# Patient Record
Sex: Female | Born: 1945 | Race: White | Hispanic: No | Marital: Married | Smoking: Current every day smoker
Health system: Southern US, Community
[De-identification: ages and names within clinical notes are randomized; demographics above are authoritative.]

## PROBLEM LIST (undated history)

## (undated) DIAGNOSIS — F32A Depression, unspecified: Secondary | ICD-10-CM

## (undated) DIAGNOSIS — I10 Essential (primary) hypertension: Secondary | ICD-10-CM

## (undated) DIAGNOSIS — R519 Headache, unspecified: Secondary | ICD-10-CM

## (undated) DIAGNOSIS — R079 Chest pain, unspecified: Secondary | ICD-10-CM

## (undated) DIAGNOSIS — I341 Nonrheumatic mitral (valve) prolapse: Secondary | ICD-10-CM

## (undated) DIAGNOSIS — F329 Major depressive disorder, single episode, unspecified: Secondary | ICD-10-CM

## (undated) DIAGNOSIS — F419 Anxiety disorder, unspecified: Secondary | ICD-10-CM

## (undated) DIAGNOSIS — M1990 Unspecified osteoarthritis, unspecified site: Secondary | ICD-10-CM

## (undated) DIAGNOSIS — R51 Headache: Secondary | ICD-10-CM

## (undated) DIAGNOSIS — R011 Cardiac murmur, unspecified: Secondary | ICD-10-CM

## (undated) DIAGNOSIS — Z720 Tobacco use: Secondary | ICD-10-CM

## (undated) DIAGNOSIS — G709 Myoneural disorder, unspecified: Secondary | ICD-10-CM

## (undated) DIAGNOSIS — E785 Hyperlipidemia, unspecified: Secondary | ICD-10-CM

## (undated) DIAGNOSIS — K219 Gastro-esophageal reflux disease without esophagitis: Secondary | ICD-10-CM

## (undated) DIAGNOSIS — G894 Chronic pain syndrome: Secondary | ICD-10-CM

## (undated) DIAGNOSIS — G43909 Migraine, unspecified, not intractable, without status migrainosus: Secondary | ICD-10-CM

## (undated) HISTORY — DX: Chronic pain syndrome: G89.4

## (undated) HISTORY — DX: Headache: R51

## (undated) HISTORY — DX: Gastro-esophageal reflux disease without esophagitis: K21.9

## (undated) HISTORY — PX: ABDOMINAL HYSTERECTOMY: SHX81

## (undated) HISTORY — DX: Migraine, unspecified, not intractable, without status migrainosus: G43.909

## (undated) HISTORY — PX: CATARACT EXTRACTION: SUR2

## (undated) HISTORY — PX: OTHER SURGICAL HISTORY: SHX169

## (undated) HISTORY — DX: Headache, unspecified: R51.9

## (undated) HISTORY — PX: CARPAL TUNNEL RELEASE: SHX101

## (undated) HISTORY — DX: Chest pain, unspecified: R07.9

## (undated) HISTORY — DX: Hyperlipidemia, unspecified: E78.5

## (undated) HISTORY — PX: SHOULDER ARTHROSCOPY: SHX128

## (undated) HISTORY — PX: EYE SURGERY: SHX253

## (undated) HISTORY — DX: Nonrheumatic mitral (valve) prolapse: I34.1

## (undated) HISTORY — DX: Tobacco use: Z72.0

---

## 1998-10-09 ENCOUNTER — Encounter: Payer: Self-pay | Admitting: Gynecology

## 1998-10-10 ENCOUNTER — Ambulatory Visit: Admission: RE | Admit: 1998-10-10 | Discharge: 1998-10-10 | Payer: Self-pay | Admitting: Gynecology

## 1998-10-30 ENCOUNTER — Ambulatory Visit (HOSPITAL_COMMUNITY): Admission: RE | Admit: 1998-10-30 | Discharge: 1998-10-30 | Payer: Self-pay | Admitting: Gastroenterology

## 1998-11-18 ENCOUNTER — Inpatient Hospital Stay (HOSPITAL_COMMUNITY): Admission: RE | Admit: 1998-11-18 | Discharge: 1998-11-19 | Payer: Self-pay | Admitting: Gynecology

## 1999-11-24 ENCOUNTER — Other Ambulatory Visit: Admission: RE | Admit: 1999-11-24 | Discharge: 1999-11-24 | Payer: Self-pay | Admitting: Gynecology

## 2000-05-29 ENCOUNTER — Other Ambulatory Visit: Admission: RE | Admit: 2000-05-29 | Discharge: 2000-05-29 | Payer: Self-pay | Admitting: Gynecology

## 2001-02-05 ENCOUNTER — Encounter: Admission: RE | Admit: 2001-02-05 | Discharge: 2001-02-13 | Payer: Self-pay | Admitting: Orthopedic Surgery

## 2001-05-29 ENCOUNTER — Other Ambulatory Visit: Admission: RE | Admit: 2001-05-29 | Discharge: 2001-05-29 | Payer: Self-pay | Admitting: Gynecology

## 2002-05-14 ENCOUNTER — Other Ambulatory Visit: Admission: RE | Admit: 2002-05-14 | Discharge: 2002-05-14 | Payer: Self-pay | Admitting: Gynecology

## 2002-06-17 ENCOUNTER — Ambulatory Visit (HOSPITAL_COMMUNITY): Admission: RE | Admit: 2002-06-17 | Discharge: 2002-06-17 | Payer: Self-pay | Admitting: Gastroenterology

## 2002-06-17 ENCOUNTER — Encounter (INDEPENDENT_AMBULATORY_CARE_PROVIDER_SITE_OTHER): Payer: Self-pay | Admitting: *Deleted

## 2003-02-26 ENCOUNTER — Encounter: Admission: RE | Admit: 2003-02-26 | Discharge: 2003-02-26 | Payer: Self-pay | Admitting: General Surgery

## 2003-02-26 ENCOUNTER — Encounter: Payer: Self-pay | Admitting: General Surgery

## 2003-06-10 ENCOUNTER — Other Ambulatory Visit: Admission: RE | Admit: 2003-06-10 | Discharge: 2003-06-10 | Payer: Self-pay | Admitting: Gynecology

## 2003-09-01 ENCOUNTER — Encounter: Admission: RE | Admit: 2003-09-01 | Discharge: 2003-09-01 | Payer: Self-pay | Admitting: General Surgery

## 2003-09-01 ENCOUNTER — Encounter: Payer: Self-pay | Admitting: General Surgery

## 2004-03-01 ENCOUNTER — Encounter: Admission: RE | Admit: 2004-03-01 | Discharge: 2004-03-01 | Payer: Self-pay | Admitting: Gynecology

## 2004-06-22 ENCOUNTER — Other Ambulatory Visit: Admission: RE | Admit: 2004-06-22 | Discharge: 2004-06-22 | Payer: Self-pay | Admitting: Gynecology

## 2004-07-02 ENCOUNTER — Ambulatory Visit (HOSPITAL_BASED_OUTPATIENT_CLINIC_OR_DEPARTMENT_OTHER): Admission: RE | Admit: 2004-07-02 | Discharge: 2004-07-02 | Payer: Self-pay | Admitting: Orthopedic Surgery

## 2004-07-02 ENCOUNTER — Ambulatory Visit (HOSPITAL_COMMUNITY): Admission: RE | Admit: 2004-07-02 | Discharge: 2004-07-02 | Payer: Self-pay | Admitting: Orthopedic Surgery

## 2004-11-02 ENCOUNTER — Ambulatory Visit: Payer: Self-pay | Admitting: Family Medicine

## 2005-03-15 ENCOUNTER — Encounter: Admission: RE | Admit: 2005-03-15 | Discharge: 2005-03-15 | Payer: Self-pay | Admitting: Gynecology

## 2005-04-06 ENCOUNTER — Ambulatory Visit: Payer: Self-pay | Admitting: Family Medicine

## 2005-06-22 ENCOUNTER — Other Ambulatory Visit: Admission: RE | Admit: 2005-06-22 | Discharge: 2005-06-22 | Payer: Self-pay | Admitting: Gynecology

## 2005-10-11 ENCOUNTER — Ambulatory Visit: Payer: Self-pay | Admitting: Family Medicine

## 2006-02-01 ENCOUNTER — Ambulatory Visit: Payer: Self-pay | Admitting: Family Medicine

## 2006-02-21 ENCOUNTER — Ambulatory Visit: Payer: Self-pay | Admitting: Family Medicine

## 2006-02-28 ENCOUNTER — Ambulatory Visit: Payer: Self-pay | Admitting: Family Medicine

## 2006-03-21 ENCOUNTER — Encounter: Admission: RE | Admit: 2006-03-21 | Discharge: 2006-04-11 | Payer: Self-pay | Admitting: Orthopedic Surgery

## 2006-04-06 ENCOUNTER — Encounter: Admission: RE | Admit: 2006-04-06 | Discharge: 2006-04-06 | Payer: Self-pay | Admitting: Gynecology

## 2006-06-08 ENCOUNTER — Other Ambulatory Visit: Admission: RE | Admit: 2006-06-08 | Discharge: 2006-06-08 | Payer: Self-pay | Admitting: Gynecology

## 2006-06-08 ENCOUNTER — Ambulatory Visit: Payer: Self-pay | Admitting: Family Medicine

## 2007-04-10 ENCOUNTER — Encounter: Admission: RE | Admit: 2007-04-10 | Discharge: 2007-04-10 | Payer: Self-pay | Admitting: Gynecology

## 2007-06-12 ENCOUNTER — Other Ambulatory Visit: Admission: RE | Admit: 2007-06-12 | Discharge: 2007-06-12 | Payer: Self-pay | Admitting: Gynecology

## 2008-04-10 ENCOUNTER — Encounter: Admission: RE | Admit: 2008-04-10 | Discharge: 2008-04-10 | Payer: Self-pay | Admitting: Gynecology

## 2008-04-21 ENCOUNTER — Encounter: Admission: RE | Admit: 2008-04-21 | Discharge: 2008-04-21 | Payer: Self-pay | Admitting: Gynecology

## 2008-06-30 ENCOUNTER — Other Ambulatory Visit: Admission: RE | Admit: 2008-06-30 | Discharge: 2008-06-30 | Payer: Self-pay | Admitting: Gynecology

## 2009-04-16 ENCOUNTER — Encounter: Admission: RE | Admit: 2009-04-16 | Discharge: 2009-04-16 | Payer: Self-pay | Admitting: Gynecology

## 2009-04-23 ENCOUNTER — Encounter: Admission: RE | Admit: 2009-04-23 | Discharge: 2009-04-23 | Payer: Self-pay | Admitting: Gynecology

## 2010-04-26 ENCOUNTER — Encounter: Admission: RE | Admit: 2010-04-26 | Discharge: 2010-04-26 | Payer: Self-pay | Admitting: Gynecology

## 2010-10-25 ENCOUNTER — Encounter: Payer: Self-pay | Admitting: Physician Assistant

## 2010-10-26 ENCOUNTER — Encounter: Payer: Self-pay | Admitting: Physician Assistant

## 2010-10-26 ENCOUNTER — Ambulatory Visit: Payer: Self-pay | Admitting: Cardiology

## 2010-11-01 ENCOUNTER — Encounter: Payer: Self-pay | Admitting: Physician Assistant

## 2010-11-01 ENCOUNTER — Ambulatory Visit: Payer: Self-pay | Admitting: Cardiology

## 2010-11-01 DIAGNOSIS — R079 Chest pain, unspecified: Secondary | ICD-10-CM

## 2010-11-01 DIAGNOSIS — K219 Gastro-esophageal reflux disease without esophagitis: Secondary | ICD-10-CM | POA: Insufficient documentation

## 2010-11-01 DIAGNOSIS — F17200 Nicotine dependence, unspecified, uncomplicated: Secondary | ICD-10-CM | POA: Insufficient documentation

## 2010-11-01 DIAGNOSIS — E7800 Pure hypercholesterolemia, unspecified: Secondary | ICD-10-CM

## 2010-11-01 DIAGNOSIS — G894 Chronic pain syndrome: Secondary | ICD-10-CM | POA: Insufficient documentation

## 2010-11-22 ENCOUNTER — Ambulatory Visit: Payer: Self-pay | Admitting: Physician Assistant

## 2010-11-22 DIAGNOSIS — I10 Essential (primary) hypertension: Secondary | ICD-10-CM | POA: Insufficient documentation

## 2010-12-27 ENCOUNTER — Encounter: Payer: Self-pay | Admitting: Gynecology

## 2011-01-04 NOTE — Letter (Signed)
Summary: [facility] D/C DR. ZHOU  [facility] D[facility]C DR. ZHOU   Imported By: Zachary George 11/01/2010 13:26:54  _____________________________________________________________________  External Attachment:    Type:   Image     Comment:   External Document

## 2011-01-04 NOTE — Consult Note (Signed)
Summary: CARDIOLOGY CONSULT/ [facility]  CARDIOLOGY CONSULT/ [facility]   Imported By: Zachary George 11/01/2010 13:25:45  _____________________________________________________________________  External Attachment:    Type:   Image     Comment:   External Document

## 2011-01-06 NOTE — Assessment & Plan Note (Signed)
Summary: eph -d/c [facility] 11-22 was a new consult when in hospital.   Visit Type:  Follow-up Primary Provider:  Allyson Sabal   History of Present Illness: patient presents for post hospital followup.  She was recently referred to [place] here at [facility] for evaluation of a single episode of chest pain, with no prior history of heart disease. She ruled out for MI, had a normal 2-D echo, and was referred for an outpatient stress Cardiolite test.  The stress test was adequate and negative for any evidence of ischemia; EF 63%.  She has not had any recurrent angina. She is also close to completely stopping tobacco smoking, now down to 5 cigarettes a day.  Preventive Screening-Counseling & Management  Alcohol-Tobacco     Smoking Status: current     Smoking Cessation Counseling: yes     Packs/Day: 1/4 PPD  Current Medications (verified): 1)  Lipitor 80 Mg Tabs (Atorvastatin Calcium) .... Take 1 Tablet By Mouth Once A Day 2)  Valium 2 Mg Tabs (Diazepam) .... Take 1 Tablet By Mouth Three Times A Day 3)  Prilosec 20 Mg Cpdr (Omeprazole) .... Take 1 Tablet By Mouth Once A Day 4)  Trazodone Hcl 50 Mg Tabs (Trazodone Hcl) .... Take 1 Tablet By Mouth Once A Day 5)  Fish Oil Double Strength 1200 Mg Caps (Omega-3 Fatty Acids) .... Take 1 Tablet By Mouth Once A Day 6)  Aspirin 325 Mg Tabs (Aspirin) .... Take 1 Tablet By Mouth Once A Day 7)  Calcium-Vitamin D 600-125 Mg-Unit Tabs (Calcium-Vitamin D) .... 1200mg  Take 1 Tablet By Mouth Once A Day 8)  Ropinirole Hcl 0.5 Mg Tabs (Ropinirole Hcl) .... Take 2 Tablet By Mouth Once A Day 9)  Hydrocodone-Acetaminophen 5-500 Mg Tabs (Hydrocodone-Acetaminophen) .... As Needed Headache  Allergies (verified): No Known Drug Allergies  Comments:  Nurse/Medical Assistant: The patient's medication list and allergies were reviewed with the patient and were updated in the Medication and Allergy Lists.  Past History:  Past Medical History: Last updated:  11/01/2010 G E R D Headaches/Migraines Mitral Valve Proplase Dyslipidemia tobacco Abuse Chronic pain syndrome Chest pains  Social History: Packs/Day:  1/4 PPD  Review of Systems       No fevers, chills, hemoptysis, dysphagia, melena, hematocheezia, hematuria, rash, claudication, orthopnea, pnd, pedal edema. All other systems negative.   Vital Signs:  Patient profile:   65 year old female Height:      62 inches Weight:      131 pounds BMI:     24.05 Pulse rate:   92 / minute BP sitting:   144 / 90  (left arm) Cuff size:   regular  Vitals Entered By: Carlye Grippe (November 22, 2010 11:15 AM)  Physical Exam  Additional Exam:  GEN: 65 year old female, no distress HEENT: NCAT,PERRLA,EOMI NECK: palpable pulses, no bruits; no JVD; no TM LUNGS: CTA bilaterally HEART: RRR (S1S2); no significant murmurs; no rubs; no gallops ABD: soft, NT; intact BS EXT: intact distal pulses; no edema SKIN: warm, dry MUSC: no obvious deformity NEURO: A/O (x3)     Impression & Recommendations:  Problem # 1:  CHEST PAIN UNSPECIFIED (ICD-786.50)  no further cardiac workup indicated. Primary prevention was, however, strongly recommended. Patient is working hard on stopping smoking altogether. She is to remain on low dose aspirin, indefinitely, and to follow with her primary care physician, Dr. Samuel Jester. She is to otherwise return to Dr. Diona Browner, on an as needed basis.  Problem # 2:  HYPERTENSION (  ICD-401.9)  patient presents with no prior history of such. She was advised to monitor this in the ambulatory setting, and to follow with Dr. Charm Barges.  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 Charm Barges.  Appended Document: eph -d/c [facility] 11-22 was a new consult when in hospital.    Clinical Lists Changes  Medications: Changed medication from ASPIRIN 325 MG TABS (ASPIRIN) Take 1 tablet by mouth once a day to ASPIRIN 81 MG TBEC  (ASPIRIN) Take one tablet by mouth daily Observations: Added new observation of PI CARDIO: as needed follow up (11/22/2010 11:45)       Patient Instructions: 1)  as needed follow up

## 2011-04-22 ENCOUNTER — Other Ambulatory Visit: Payer: Self-pay | Admitting: Gynecology

## 2011-04-22 DIAGNOSIS — Z1231 Encounter for screening mammogram for malignant neoplasm of breast: Secondary | ICD-10-CM

## 2011-04-22 NOTE — Op Note (Signed)
NAME:  [name], [name]                    ACCOUNT NO.:  [id number]   MEDICAL RECORD NO.:  [id number]                   PATIENT TYPE:  AMB   LOCATION:  DSC                                  FACILITY:  [facility]   PHYSICIAN:  Katy Fitch. Naaman Plummer., M.D.          DATE OF BIRTH:  [birth year]   DATE OF PROCEDURE:  07/02/2004  DATE OF DISCHARGE:                                 OPERATIVE REPORT   PREOPERATIVE DIAGNOSES:  1. Entrapment neuropathy of median nerve, left carpal tunnel.  2. Enlarging left volar ganglion cyst adjacent to radial artery bifurcation.   POSTOPERATIVE DIAGNOSES:  1. Entrapment neuropathy of median nerve, left carpal tunnel.  2. Enlarging left volar ganglion cyst adjacent to radial artery bifurcation.   OPERATION PERFORMED:  1. Release of __________  transverse carpal ligament.  2. Rection of __________ volar ganglion.   SURGEON:  Katy Fitch. Sypher, M.D.   ASSISTANT:  Jonni Sanger, P.A.   ANESTHESIA:  General by LMA.   SUPERVISING ANESTHESIOLOGIST:  Janetta Hora. Gelene Mink, M.D.   INDICATIONS FOR PROCEDURE:  Amy Santos is a 65 year old woman  referred by Colon Flattery, D.O. in [place], [place] for evaluation and  management of a painful and numb left hand and an enlarging mass on the  volar radial aspect of the left wrist.  Clinical examination revealed signs  of carpal tunnel syndrome and a volar ganglion at the bifurcation of the  radial artery, radial to the flexor carpi radialis tendon.  Electrodiagnostic studies confirmed significant carpal tunnel syndrome.  Due  to failure to respond to nonoperative measures, the patient is brought to  the operating room at this time anticipating left carpal tunnel release and  excision of her volar ganglion.   DESCRIPTION OF PROCEDURE:  Runa Whittingham was brought to the operating  room and placed in supine position upon the operating table.  Following  induction of general anesthesia by LMA, the left  arm was prepped with  Betadine soap and solution and sterilely draped.  Following exsanguination  of the limb with an Esmarch bandage, an arterial tourniquet on the proximal  brachium was inflated to 220 mmHg.  The procedure commenced with a short  incision in line with the ring finger in the palm.  Subcutaneous tissues are  carefully divided revealing the palmar fascia.  This was split  longitudinally to reveal the common sensory branch of the median nerve.  This was followed back to the transverse carpal ligament which was carefully  isolated from the median nerve.  The ligament was released on its ulnar  border extending to the distal forearm.  This widely opened the carpal  canal.  No masses or other predicaments were noted.  The wound was then  repaired with intradermal 3-0 Prolene suture.   Attention was then turned to the volar radial wrist.  A transverse incision  was fashioned directly over the mass.  Subcutaneous tissues were carefully  divided taking care to  identify the radial artery proper and the radial  superficial sensory branches.  The cyst was quite complex with multiple  lobes surrounding and involving a plexus of veins adjacent to the  bifurcation of the radial artery.  The dorsal branch and palmar branch of  the radial artery were identified and carefully isolated followed by removal  of the cyst with the plexus of veins attached to the cyst.  The cyst  appeared to be exiting from the volar wrist capsule adjacent to the  radioscaphocapitate ligament.  All of the cyst tissues were carefully  debrided off the volar wrist ligaments and the exit point was  electrocauterized with bipolar forceps.  The wound was then irrigated and  repaired with intradermal 3-0 Prolene.   A compressive dressing was applied with a volar plaster splint maintaining  the wrist in five degrees dorsiflexion.  There were no apparent  complications.  Ms. Barletta tolerated the surgery and  anesthesia well.  She will be discharged to the care of her family with prescriptions for  Percocet 5 mg one or two tablets by mouth every four to six hours as needed  for pain, also Advil three tablets by mouth every six hours as needed for  pain.                                               Katy Fitch Naaman Plummer., M.D.    RVS/MEDQ  D:  07/02/2004  T:  07/02/2004  Job:  [id number]

## 2011-05-09 ENCOUNTER — Ambulatory Visit
Admission: RE | Admit: 2011-05-09 | Discharge: 2011-05-09 | Disposition: A | Discharge status: Home / Self Care | Payer: Private Health Insurance - Indemnity | Source: Ambulatory Visit | Attending: Gynecology | Admitting: Gynecology

## 2011-05-09 DIAGNOSIS — Z1231 Encounter for screening mammogram for malignant neoplasm of breast: Secondary | ICD-10-CM

## 2011-09-15 NOTE — Progress Notes (Signed)
This encounter was created in error - please disregard.

## 2012-04-11 ENCOUNTER — Other Ambulatory Visit: Payer: Self-pay | Admitting: Gynecology

## 2012-04-11 DIAGNOSIS — Z1231 Encounter for screening mammogram for malignant neoplasm of breast: Secondary | ICD-10-CM

## 2012-05-09 ENCOUNTER — Ambulatory Visit
Admission: RE | Admit: 2012-05-09 | Discharge: 2012-05-09 | Disposition: A | Discharge status: Home / Self Care | Payer: No Typology Code available for payment source | Source: Ambulatory Visit | Attending: Gynecology | Admitting: Gynecology

## 2012-05-09 DIAGNOSIS — Z1231 Encounter for screening mammogram for malignant neoplasm of breast: Secondary | ICD-10-CM

## 2012-05-30 ENCOUNTER — Encounter: Payer: Self-pay | Admitting: Cardiology

## 2012-06-26 ENCOUNTER — Ambulatory Visit: Payer: Medicare Other | Attending: Neurosurgery | Admitting: Physical Therapy

## 2012-06-26 DIAGNOSIS — M545 Low back pain, unspecified: Secondary | ICD-10-CM | POA: Insufficient documentation

## 2012-06-26 DIAGNOSIS — R5381 Other malaise: Secondary | ICD-10-CM | POA: Insufficient documentation

## 2012-06-26 DIAGNOSIS — M25559 Pain in unspecified hip: Secondary | ICD-10-CM | POA: Insufficient documentation

## 2012-06-26 DIAGNOSIS — IMO0001 Reserved for inherently not codable concepts without codable children: Secondary | ICD-10-CM | POA: Insufficient documentation

## 2012-06-27 ENCOUNTER — Ambulatory Visit: Payer: Medicare Other | Admitting: Physical Therapy

## 2012-06-28 ENCOUNTER — Ambulatory Visit: Payer: Medicare Other | Admitting: *Deleted

## 2012-07-03 ENCOUNTER — Ambulatory Visit: Payer: Medicare Other | Admitting: Physical Therapy

## 2012-07-04 ENCOUNTER — Ambulatory Visit: Payer: Medicare Other | Admitting: Physical Therapy

## 2012-07-05 ENCOUNTER — Ambulatory Visit: Payer: Medicare Other | Attending: Neurosurgery | Admitting: Physical Therapy

## 2012-07-05 DIAGNOSIS — IMO0001 Reserved for inherently not codable concepts without codable children: Secondary | ICD-10-CM | POA: Insufficient documentation

## 2012-07-05 DIAGNOSIS — M545 Low back pain, unspecified: Secondary | ICD-10-CM | POA: Insufficient documentation

## 2012-07-05 DIAGNOSIS — M25559 Pain in unspecified hip: Secondary | ICD-10-CM | POA: Insufficient documentation

## 2012-07-05 DIAGNOSIS — R5381 Other malaise: Secondary | ICD-10-CM | POA: Insufficient documentation

## 2012-07-09 ENCOUNTER — Ambulatory Visit: Payer: Medicare Other | Admitting: Physical Therapy

## 2012-07-11 ENCOUNTER — Ambulatory Visit: Payer: Medicare Other | Admitting: Physical Therapy

## 2012-07-12 ENCOUNTER — Ambulatory Visit: Payer: Medicare Other | Admitting: Physical Therapy

## 2012-07-18 ENCOUNTER — Encounter: Payer: No Typology Code available for payment source | Admitting: *Deleted

## 2012-07-19 ENCOUNTER — Encounter: Payer: No Typology Code available for payment source | Admitting: *Deleted

## 2013-05-01 ENCOUNTER — Other Ambulatory Visit: Payer: Self-pay

## 2013-05-01 DIAGNOSIS — Z1231 Encounter for screening mammogram for malignant neoplasm of breast: Secondary | ICD-10-CM

## 2013-05-30 ENCOUNTER — Ambulatory Visit
Admission: RE | Admit: 2013-05-30 | Discharge: 2013-05-30 | Disposition: A | Discharge status: Home / Self Care | Payer: Medicare Other | Source: Ambulatory Visit

## 2013-05-30 DIAGNOSIS — Z1231 Encounter for screening mammogram for malignant neoplasm of breast: Secondary | ICD-10-CM

## 2014-05-06 ENCOUNTER — Other Ambulatory Visit: Payer: Self-pay

## 2014-05-06 DIAGNOSIS — Z1231 Encounter for screening mammogram for malignant neoplasm of breast: Secondary | ICD-10-CM

## 2014-06-02 ENCOUNTER — Ambulatory Visit
Admission: RE | Admit: 2014-06-02 | Discharge: 2014-06-02 | Disposition: A | Discharge status: Home / Self Care | Payer: Medicare Other | Source: Ambulatory Visit

## 2014-06-02 ENCOUNTER — Encounter (INDEPENDENT_AMBULATORY_CARE_PROVIDER_SITE_OTHER): Payer: Self-pay

## 2014-06-02 DIAGNOSIS — Z1231 Encounter for screening mammogram for malignant neoplasm of breast: Secondary | ICD-10-CM

## 2015-02-20 DIAGNOSIS — M2022 Hallux rigidus, left foot: Secondary | ICD-10-CM | POA: Diagnosis not present

## 2015-02-20 DIAGNOSIS — M2012 Hallux valgus (acquired), left foot: Secondary | ICD-10-CM | POA: Diagnosis not present

## 2015-05-14 ENCOUNTER — Ambulatory Visit (INDEPENDENT_AMBULATORY_CARE_PROVIDER_SITE_OTHER): Payer: Medicare Other | Admitting: Podiatry

## 2015-05-14 ENCOUNTER — Ambulatory Visit (INDEPENDENT_AMBULATORY_CARE_PROVIDER_SITE_OTHER): Payer: Medicare Other

## 2015-05-14 ENCOUNTER — Encounter: Payer: Self-pay | Admitting: Podiatry

## 2015-05-14 VITALS — BP 157/85 | HR 91 | Resp 16

## 2015-05-14 DIAGNOSIS — M79672 Pain in left foot: Secondary | ICD-10-CM | POA: Diagnosis not present

## 2015-05-14 DIAGNOSIS — M2012 Hallux valgus (acquired), left foot: Secondary | ICD-10-CM

## 2015-05-14 DIAGNOSIS — Z9889 Other specified postprocedural states: Secondary | ICD-10-CM

## 2015-05-14 DIAGNOSIS — Z472 Encounter for removal of internal fixation device: Secondary | ICD-10-CM

## 2015-05-14 NOTE — Progress Notes (Signed)
   Subjective:    Patient ID: Amy Santos, female    DOB: 1946/01/17, 69 y.o.   MRN: [id number]  HPI Comments: "I had surgery and was referred to Dr. Al Corpus cause of the problems"  Patient c/o aching 1st MPJ left for few months. The patient had bunion surgery by Dr. Stacie Acres on 02-20-15. She is having a lot of swelling and redness. Walking is uncomfortable.   Foot Pain Associated symptoms include arthralgias and headaches.      Review of Systems  HENT: Positive for sinus pressure.   Musculoskeletal: Positive for back pain and arthralgias.  Neurological: Positive for light-headedness and headaches.  All other systems reviewed and are negative.      Objective:   Physical Exam: I have reviewed her past medical history medications allergies surgery social history review of systems. Pulses are palpable bilateral. Neurologic sensorium is intact. Deep tendon reflexes are intact bilaterally and muscle strength +5 over 5 dorsiflexion plantar flexors and inverters and evertors onto the musculatures intact. Orthopedic evaluation of systems and ankle range of motion without crepitation. Cutaneous evaluationsupple well-hydrated cutis. This patient is status post [place] bunion repair screw fixation first metatarsal phalangeal joint left foot. Surgery was March 2016. She is still swollen and complaining of pain. She has pain on palpation medial aspect first metatarsophalangeal joint. No pain on end range of motion of the joint. She is dead set in her mind that it is the screw that is painful for her. I demonstrated to her today radiographically that more than likely was not the screw but she will still have it removed.        Assessment & Plan:  Assessment: Status post [place] bunion repair left foot. Painful internal fixation can rule out a fracture or nonunion first metatarsophalangeal joint.  Plan: We went over consent form today O liner by number  Elijah Birk has what she sulfate regarding removal  internal fixation left foot. I answered all questions regarding this procedure to the best of my ability and limbs times. She understood it was amenable to it and signed of the consent form.

## 2015-05-14 NOTE — Patient Instructions (Signed)
Pre-Operative Instructions  Congratulations, you have decided to take an important step to improving your quality of life.  You can be assured that the doctors of [facility] will be with you every step of the way.  1. Plan to be at the surgery center/hospital at least 1 (one) hour prior to your scheduled time unless otherwise directed by the surgical center/hospital staff.  You must have a responsible adult accompany you, remain during the surgery and drive you home.  Make sure you have directions to the surgical center/hospital and know how to get there on time. 2. For hospital based surgery you will need to obtain a history and physical form from your family physician within 1 month prior to the date of surgery- we will give you a form for you primary physician.  3. We make every effort to accommodate the date you request for surgery.  There are however, times where surgery dates or times have to be moved.  We will contact you as soon as possible if a change in schedule is required.   4. No Aspirin/Ibuprofen for one week before surgery.  If you are on aspirin, any non-steroidal anti-inflammatory medications (Mobic, Aleve, Ibuprofen) you should stop taking it 7 days prior to your surgery.  You make take Tylenol  For pain prior to surgery.  5. Medications- If you are taking daily heart and blood pressure medications, seizure, reflux, allergy, asthma, anxiety, pain or diabetes medications, make sure the surgery center/hospital is aware before the day of surgery so they may notify you which medications to take or avoid the day of surgery. 6. No food or drink after midnight the night before surgery unless directed otherwise by surgical center/hospital staff. 7. No alcoholic beverages 24 hours prior to surgery.  No smoking 24 hours prior to or 24 hours after surgery. 8. Wear loose pants or shorts- loose enough to fit over bandages, boots, and casts. 9. No slip on shoes, sneakers are best. 10. Bring  your boot with you to the surgery center/hospital.  Also bring crutches or a walker if your physician has prescribed it for you.  If you do not have this equipment, it will be provided for you after surgery. 11. If you have not been contracted by the surgery center/hospital by the day before your surgery, call to confirm the date and time of your surgery. 12. Leave-time from work may vary depending on the type of surgery you have.  Appropriate arrangements should be made prior to surgery with your employer. 13. Prescriptions will be provided immediately following surgery by your doctor.  Have these filled as soon as possible after surgery and take the medication as directed. 14. Remove nail polish on the operative foot. 15. Wash the night before surgery.  The night before surgery wash the foot and leg well with the antibacterial soap provided and water paying special attention to beneath the toenails and in between the toes.  Rinse thoroughly with water and dry well with a towel.  Perform this wash unless told not to do so by your physician.  Enclosed: 1 Ice pack (please put in freezer the night before surgery)   1 Hibiclens skin cleaner   Pre-op Instructions  If you have any questions regarding the instructions, do not hesitate to call our office.  [place]: [address] [place], [place] [address] [phone]  [place]: [address] [place], [place] [address] [phone]  [place]: [address]  , [place] [address] [phone]  Dr. Richard   Tuchman DPM, Dr. Norman Regal DPM Dr. Richard Sikora DPM, Dr. M. Todd Hyatt DPM, Dr. Kathryn Egerton DPM 

## 2015-06-04 ENCOUNTER — Other Ambulatory Visit: Payer: Self-pay | Admitting: Podiatry

## 2015-06-04 MED ORDER — HYDROCODONE-ACETAMINOPHEN 5-325 MG PO TABS: 1.0000 | ORAL_TABLET | Freq: Four times a day (QID) | ORAL | Status: DC | PRN

## 2015-06-04 MED ORDER — CEPHALEXIN 500 MG PO CAPS: 500.0000 mg | ORAL_CAPSULE | Freq: Three times a day (TID) | ORAL | Status: DC

## 2015-06-05 ENCOUNTER — Encounter: Payer: Self-pay | Admitting: Podiatry

## 2015-06-05 DIAGNOSIS — Z4889 Encounter for other specified surgical aftercare: Secondary | ICD-10-CM | POA: Diagnosis not present

## 2015-06-11 ENCOUNTER — Ambulatory Visit (INDEPENDENT_AMBULATORY_CARE_PROVIDER_SITE_OTHER): Payer: Medicare Other | Admitting: Podiatry

## 2015-06-11 ENCOUNTER — Encounter: Payer: Self-pay | Admitting: Podiatry

## 2015-06-11 VITALS — BP 138/80 | HR 93 | Resp 16

## 2015-06-11 DIAGNOSIS — Z9889 Other specified postprocedural states: Secondary | ICD-10-CM

## 2015-06-11 DIAGNOSIS — Z472 Encounter for removal of internal fixation device: Secondary | ICD-10-CM

## 2015-06-11 NOTE — Progress Notes (Signed)
She presents today status post removal internal fixation first metatarsal left foot date of surgery 06/05/2015. She denies fever chills nausea vomiting muscle aches and pains. States that she feels nearly 100% improved than prior to surgery. She states the toe feels much better already.  Objective: Vital signs are stable she is alert and oriented 3 pulses are intact sterile dressing was removed demonstrates sutures are intact margins are well coapted she has great range of motion of the first metatarsophalangeal joint with no symptoms at all. Radiograph confirms removal of complete internal fixation left.  Assessment: One-week status post removal internal fixation first met left.  Plan: Dressed with a light dressing today will follow up with her in 1 week for suture removal. This may be put on the nurse's schedule.

## 2015-06-19 ENCOUNTER — Ambulatory Visit (INDEPENDENT_AMBULATORY_CARE_PROVIDER_SITE_OTHER): Payer: Medicare Other | Admitting: Podiatry

## 2015-06-19 ENCOUNTER — Encounter: Payer: Self-pay | Admitting: Podiatry

## 2015-06-19 VITALS — BP 151/86 | HR 86 | Resp 12

## 2015-06-19 DIAGNOSIS — Z9889 Other specified postprocedural states: Secondary | ICD-10-CM

## 2015-06-19 DIAGNOSIS — Z472 Encounter for removal of internal fixation device: Secondary | ICD-10-CM

## 2015-06-20 ENCOUNTER — Encounter: Payer: Medicare Other | Admitting: Podiatry

## 2015-06-22 NOTE — Progress Notes (Signed)
Patient ID: Jerilynn BirkenheadVirginia J Bun, female   DOB: [birth year], 69 y.o.   MRN: [id number]  Subjective: 69 year old female presents the office today two-week status post first metatarsal hardware removal. States that overall she is doing well and she have much pain to the site and she is not requiring any pain medication. 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 She denies any systemic complaints as fevers, chills, nausea, vomiting. Denies any calf pain, chest pain, shortness of breath.  Objective: AAO 3, NAD DP/PT pulses palpable, CRT less than 3 seconds Incision along the dorsal medial aspect of the left foot as well coapted without any evidence of dehiscence and sutures are intact. There is no surrounding erythema, ascending cellulitis, fluctuance, crepitus, malodor, drainage. There is no signs of infection. There is minimal edema along the surgical site. There is no tenderness to palpation along the surgical site. No other open lesions or pre-ulcerative lesions identified bilaterally. There is no pain with calf compression, swelling, warmth, erythema.  Assessment: 69 year old female 2 weeks status post internal fixation hardware removal left first metatarsal  Plan: At today's appointment the sutures are removed without complications. Antibiotic ointment is placed over the incision followed by dry sterile dressing. She can remove the dressing tomorrow and start showers on his incision remains coapted. There is a  problem the incision to hold off on showering call the office. Also she can return to regular, supportive sneaker as tolerated. ice and elevation. Follow-up in 2 weeks with Dr. Al CorpusHyatt or sooner if any palms are to arise. In the meantime I encouraged her to call the office with any questions, concerns, change in symptoms.   Ovid CurdMatthew Xee Hollman, DPM

## 2015-06-29 ENCOUNTER — Other Ambulatory Visit: Payer: Self-pay

## 2015-06-29 DIAGNOSIS — Z1231 Encounter for screening mammogram for malignant neoplasm of breast: Secondary | ICD-10-CM

## 2015-07-01 NOTE — Progress Notes (Signed)
DOS 06/05/2015 Removal fixation deep kwire/screw left.

## 2015-07-02 ENCOUNTER — Ambulatory Visit (INDEPENDENT_AMBULATORY_CARE_PROVIDER_SITE_OTHER): Payer: Medicare Other | Admitting: Podiatry

## 2015-07-02 ENCOUNTER — Encounter: Payer: Self-pay | Admitting: Podiatry

## 2015-07-02 ENCOUNTER — Ambulatory Visit (INDEPENDENT_AMBULATORY_CARE_PROVIDER_SITE_OTHER): Payer: Medicare Other

## 2015-07-02 VITALS — BP 134/75 | HR 102 | Resp 16

## 2015-07-02 DIAGNOSIS — M2580 Other specified joint disorders, unspecified joint: Secondary | ICD-10-CM

## 2015-07-02 DIAGNOSIS — M1990 Unspecified osteoarthritis, unspecified site: Secondary | ICD-10-CM

## 2015-07-02 DIAGNOSIS — Z9889 Other specified postprocedural states: Secondary | ICD-10-CM

## 2015-07-02 DIAGNOSIS — Z472 Encounter for removal of internal fixation device: Secondary | ICD-10-CM | POA: Diagnosis not present

## 2015-07-02 DIAGNOSIS — M779 Enthesopathy, unspecified: Secondary | ICD-10-CM | POA: Diagnosis not present

## 2015-07-02 MED ORDER — METHYLPREDNISOLONE 4 MG PO TBPK: ORAL_TABLET | ORAL | Status: DC

## 2015-07-04 NOTE — Progress Notes (Signed)
She presents today for follow-up of her first metatarsophalangeal joint where we removed a screw status post bunion repair left. She states that he was doing great with her stool with and this seems to be something still going on with it. It is still moderately painful. She denies any trauma. She denies any changes in her past medical history medications or allergies.  Objective: Vital signs are stable she is alert and oriented 3. Pulses are palpable. Incision site is gone on to heal uneventfully. She has palpable clicking on range of motion of the tibial sesamoid left.  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  Plan: Started her on a Medrol Dosepak and injected the area today with Kenalog and local anesthesia. I would allow her to get back to her regular activities.

## 2015-07-26 ENCOUNTER — Encounter (HOSPITAL_COMMUNITY): Payer: Self-pay | Admitting: *Deleted

## 2015-07-26 ENCOUNTER — Emergency Department (HOSPITAL_COMMUNITY)
Admission: EM | Admit: 2015-07-26 | Discharge: 2015-07-26 | Disposition: A | Discharge status: Home / Self Care | Payer: No Typology Code available for payment source | Attending: Emergency Medicine | Admitting: Emergency Medicine

## 2015-07-26 DIAGNOSIS — Y998 Other external cause status: Secondary | ICD-10-CM | POA: Diagnosis not present

## 2015-07-26 DIAGNOSIS — Y92410 Unspecified street and highway as the place of occurrence of the external cause: Secondary | ICD-10-CM | POA: Insufficient documentation

## 2015-07-26 DIAGNOSIS — Z720 Tobacco use: Secondary | ICD-10-CM | POA: Diagnosis not present

## 2015-07-26 DIAGNOSIS — Y9389 Activity, other specified: Secondary | ICD-10-CM | POA: Insufficient documentation

## 2015-07-26 DIAGNOSIS — S60212A Contusion of left wrist, initial encounter: Secondary | ICD-10-CM | POA: Insufficient documentation

## 2015-07-26 DIAGNOSIS — Z8679 Personal history of other diseases of the circulatory system: Secondary | ICD-10-CM | POA: Insufficient documentation

## 2015-07-26 DIAGNOSIS — S7001XA Contusion of right hip, initial encounter: Secondary | ICD-10-CM | POA: Diagnosis not present

## 2015-07-26 DIAGNOSIS — R58 Hemorrhage, not elsewhere classified: Secondary | ICD-10-CM

## 2015-07-26 DIAGNOSIS — S60211A Contusion of right wrist, initial encounter: Secondary | ICD-10-CM | POA: Diagnosis not present

## 2015-07-26 DIAGNOSIS — T148XXA Other injury of unspecified body region, initial encounter: Secondary | ICD-10-CM

## 2015-07-26 DIAGNOSIS — G894 Chronic pain syndrome: Secondary | ICD-10-CM | POA: Diagnosis not present

## 2015-07-26 DIAGNOSIS — S6991XA Unspecified injury of right wrist, hand and finger(s), initial encounter: Secondary | ICD-10-CM | POA: Diagnosis present

## 2015-07-26 DIAGNOSIS — S4992XA Unspecified injury of left shoulder and upper arm, initial encounter: Secondary | ICD-10-CM | POA: Insufficient documentation

## 2015-07-26 DIAGNOSIS — E785 Hyperlipidemia, unspecified: Secondary | ICD-10-CM | POA: Insufficient documentation

## 2015-07-26 DIAGNOSIS — Z79899 Other long term (current) drug therapy: Secondary | ICD-10-CM | POA: Diagnosis not present

## 2015-07-26 DIAGNOSIS — S0990XA Unspecified injury of head, initial encounter: Secondary | ICD-10-CM | POA: Insufficient documentation

## 2015-07-26 DIAGNOSIS — S4991XA Unspecified injury of right shoulder and upper arm, initial encounter: Secondary | ICD-10-CM | POA: Diagnosis not present

## 2015-07-26 DIAGNOSIS — S99922A Unspecified injury of left foot, initial encounter: Secondary | ICD-10-CM | POA: Diagnosis not present

## 2015-07-26 DIAGNOSIS — K219 Gastro-esophageal reflux disease without esophagitis: Secondary | ICD-10-CM | POA: Diagnosis not present

## 2015-07-26 DIAGNOSIS — Z7982 Long term (current) use of aspirin: Secondary | ICD-10-CM | POA: Insufficient documentation

## 2015-07-26 DIAGNOSIS — Z9889 Other specified postprocedural states: Secondary | ICD-10-CM | POA: Insufficient documentation

## 2015-07-26 MED ORDER — METHOCARBAMOL 500 MG PO TABS: 500.0000 mg | ORAL_TABLET | Freq: Four times a day (QID) | ORAL | Status: DC
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
## 2015-07-26 NOTE — ED Notes (Signed)
Declined W/C at D/C and was escorted to lobby by RN. 
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
## 2015-07-30 ENCOUNTER — Encounter: Payer: Medicare Other | Admitting: Podiatry

## 2015-08-04 ENCOUNTER — Ambulatory Visit: Payer: Medicare Other

## 2015-08-11 ENCOUNTER — Ambulatory Visit (INDEPENDENT_AMBULATORY_CARE_PROVIDER_SITE_OTHER): Payer: Medicare Other | Admitting: Podiatry

## 2015-08-11 ENCOUNTER — Encounter: Payer: Self-pay | Admitting: Podiatry

## 2015-08-11 ENCOUNTER — Ambulatory Visit (INDEPENDENT_AMBULATORY_CARE_PROVIDER_SITE_OTHER): Payer: Medicare Other

## 2015-08-11 VITALS — BP 129/79 | HR 91 | Resp 16

## 2015-08-11 DIAGNOSIS — Z9889 Other specified postprocedural states: Secondary | ICD-10-CM | POA: Diagnosis not present

## 2015-08-11 DIAGNOSIS — S92212A Displaced fracture of cuboid bone of left foot, initial encounter for closed fracture: Secondary | ICD-10-CM

## 2015-08-11 MED ORDER — HYDROCODONE-ACETAMINOPHEN 5-325 MG PO TABS: 1.0000 | ORAL_TABLET | Freq: Four times a day (QID) | ORAL | Status: DC | PRN
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
## 2015-09-08 ENCOUNTER — Ambulatory Visit (INDEPENDENT_AMBULATORY_CARE_PROVIDER_SITE_OTHER): Payer: Medicare Other

## 2015-09-08 ENCOUNTER — Encounter: Payer: Self-pay | Admitting: Podiatry

## 2015-09-08 ENCOUNTER — Ambulatory Visit (INDEPENDENT_AMBULATORY_CARE_PROVIDER_SITE_OTHER): Payer: Medicare Other | Admitting: Podiatry

## 2015-09-08 VITALS — BP 127/73 | HR 85 | Resp 16

## 2015-09-08 DIAGNOSIS — S92212D Displaced fracture of cuboid bone of left foot, subsequent encounter for fracture with routine healing: Secondary | ICD-10-CM | POA: Diagnosis not present

## 2015-09-08 DIAGNOSIS — M792 Neuralgia and neuritis, unspecified: Secondary | ICD-10-CM | POA: Diagnosis not present

## 2015-09-08 DIAGNOSIS — M79672 Pain in left foot: Secondary | ICD-10-CM

## 2015-09-08 DIAGNOSIS — M779 Enthesopathy, unspecified: Secondary | ICD-10-CM | POA: Diagnosis not present

## 2015-09-08 NOTE — Progress Notes (Signed)
She presents today for follow-up of her fractured cuboid of her left foot. She states that is doing much better and she is very happy with that. She states that she still has some tenderness beneath the second metatarsophalangeal joint along the medial aspect of the first metatarsophalangeal joint on the left foot. She denies any trauma. She has been wearing her Darco shoe for an extended period of time.  Objective: Vital signs are stable she is alert and oriented 3. Pulses are strongly palpable. She has pain on palpation of the medial dorsal cutaneous nerve of the left first metatarsophalangeal joint as well as the  second metatarsophalangeal joint left foot. Pulses are strongly palpable pain on end range of motion.  Assessment: Capsulitis second metatarsophalangeal joint. Neuritis first metatarsophalangeal joint. Well healed cuboid fracture left.  Plan: Injected the second metatarsophalangeal joint today with Kenalog and local and aesthetic. The neuritis was injected with dexamethasone and local anesthesia. Keep back and a regular shoe gear and I will follow-up with her in the near future.  Arbutus Ped DPM

## 2015-09-30 ENCOUNTER — Other Ambulatory Visit: Payer: Self-pay

## 2015-09-30 ENCOUNTER — Emergency Department (HOSPITAL_COMMUNITY)
Admission: EM | Admit: 2015-09-30 | Discharge: 2015-09-30 | Disposition: A | Discharge status: Home / Self Care | Payer: Medicare Other | Attending: Emergency Medicine | Admitting: Emergency Medicine

## 2015-09-30 ENCOUNTER — Encounter (HOSPITAL_COMMUNITY): Payer: Self-pay | Admitting: Emergency Medicine

## 2015-09-30 DIAGNOSIS — R5381 Other malaise: Secondary | ICD-10-CM | POA: Diagnosis not present

## 2015-09-30 DIAGNOSIS — R42 Dizziness and giddiness: Secondary | ICD-10-CM | POA: Insufficient documentation

## 2015-09-30 DIAGNOSIS — Z79899 Other long term (current) drug therapy: Secondary | ICD-10-CM | POA: Diagnosis not present

## 2015-09-30 DIAGNOSIS — R000 Tachycardia, unspecified: Secondary | ICD-10-CM | POA: Diagnosis not present

## 2015-09-30 DIAGNOSIS — Z8719 Personal history of other diseases of the digestive system: Secondary | ICD-10-CM | POA: Insufficient documentation

## 2015-09-30 DIAGNOSIS — Z720 Tobacco use: Secondary | ICD-10-CM | POA: Insufficient documentation

## 2015-09-30 DIAGNOSIS — Z8679 Personal history of other diseases of the circulatory system: Secondary | ICD-10-CM | POA: Diagnosis not present

## 2015-09-30 DIAGNOSIS — Z7982 Long term (current) use of aspirin: Secondary | ICD-10-CM | POA: Insufficient documentation

## 2015-09-30 DIAGNOSIS — G894 Chronic pain syndrome: Secondary | ICD-10-CM | POA: Diagnosis not present

## 2015-09-30 DIAGNOSIS — E785 Hyperlipidemia, unspecified: Secondary | ICD-10-CM | POA: Diagnosis not present

## 2015-09-30 DIAGNOSIS — R531 Weakness: Secondary | ICD-10-CM | POA: Diagnosis not present

## 2015-09-30 DIAGNOSIS — Z7951 Long term (current) use of inhaled steroids: Secondary | ICD-10-CM | POA: Diagnosis not present

## 2015-09-30 LAB — BASIC METABOLIC PANEL
Anion gap: 8 (ref 5–15)
BUN: 13 mg/dL (ref 6–20)
CO2: 25 mmol/L (ref 22–32)
Calcium: 9.4 mg/dL (ref 8.9–10.3)
Chloride: 108 mmol/L (ref 101–111)
Creatinine, Ser: 0.82 mg/dL (ref 0.44–1.00)
GFR calc Af Amer: >60 mL/min (ref >60)
GFR calc non Af Amer: >60 mL/min (ref >60)
Glucose, Bld: 119 mg/dL — ABNORMAL HIGH (ref 65–99)
Potassium: 3.4 mmol/L — ABNORMAL LOW (ref 3.5–5.1)
Sodium: 141 mmol/L (ref 135–145)

## 2015-09-30 LAB — CBC WITH DIFFERENTIAL/PLATELET
Basophils Absolute: 0 10*3/uL (ref 0.0–0.1)
Basophils Relative: 0 %
Eosinophils Absolute: 0 10*3/uL (ref 0.0–0.7)
Eosinophils Relative: 1 %
HCT: 41 % (ref 36.0–46.0)
Hemoglobin: 14.1 g/dL (ref 12.0–15.0)
Lymphocytes Relative: 21 %
Lymphs Abs: 1.3 10*3/uL (ref 0.7–4.0)
MCH: 33.4 pg (ref 26.0–34.0)
MCHC: 34.4 g/dL (ref 30.0–36.0)
MCV: 97.2 fL (ref 78.0–100.0)
Monocytes Absolute: 0.6 10*3/uL (ref 0.1–1.0)
Monocytes Relative: 10 %
Neutro Abs: 4.1 10*3/uL (ref 1.7–7.7)
Neutrophils Relative %: 68 %
Platelets: 233 10*3/uL (ref 150–400)
RBC: 4.22 MIL/uL (ref 3.87–5.11)
RDW: 13.6 % (ref 11.5–15.5)
WBC: 5.9 10*3/uL (ref 4.0–10.5)

## 2015-09-30 LAB — URINALYSIS, ROUTINE W REFLEX MICROSCOPIC
Bilirubin Urine: NEGATIVE
Glucose, UA: NEGATIVE mg/dL
Ketones, ur: NEGATIVE mg/dL
Leukocytes, UA: NEGATIVE
Nitrite: NEGATIVE
Protein, ur: NEGATIVE mg/dL
Specific Gravity, Urine: 1.01 (ref 1.005–1.030)
Urobilinogen, UA: 0.2 mg/dL (ref 0.0–1.0)
pH: 7.5 (ref 5.0–8.0)

## 2015-09-30 LAB — URINE MICROSCOPIC-ADD ON

## 2015-09-30 MED ORDER — DIPHENHYDRAMINE HCL 50 MG/ML IJ SOLN
12.5000 mg | Freq: Once | INTRAMUSCULAR | Status: AC
Start: 2015-09-30 — End: 2015-09-30
  Administered 2015-09-30: 12.5 mg via INTRAVENOUS
  Filled 2015-09-30: qty 1

## 2015-09-30 MED ORDER — KETOROLAC TROMETHAMINE 30 MG/ML IJ SOLN
15.0000 mg | Freq: Once | INTRAMUSCULAR | Status: AC
Start: 2015-09-30 — End: 2015-09-30
  Administered 2015-09-30: 15 mg via INTRAVENOUS
  Filled 2015-09-30: qty 1

## 2015-09-30 MED ORDER — SODIUM CHLORIDE 0.9 % IV BOLUS (SEPSIS)
1000.0000 mL | Freq: Once | INTRAVENOUS | Status: AC
  Administered 2015-09-30: 1000 mL via INTRAVENOUS

## 2015-09-30 MED ORDER — PROCHLORPERAZINE EDISYLATE 5 MG/ML IJ SOLN
10.0000 mg | Freq: Once | INTRAMUSCULAR | Status: AC
  Administered 2015-09-30: 10 mg via INTRAVENOUS
  Filled 2015-09-30: qty 2
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
## 2015-09-30 NOTE — ED Provider Notes (Signed)
CSN: [id number]     Arrival date & time 09/30/15  0957 History  By signing my name below, I, Freida Busman, attest that this documentation has been prepared under the direction and in the presence of Raeford Razor, MD . Electronically Signed: Freida Busman, Scribe. 09/30/2015. 11:09 AM.  Chief Complaint  Patient presents with  . Multiple Complaints     The history is provided by the patient and a relative. No language interpreter was used.   HPI Comments:  KATHRINE RIEVES is a 69 y.o. female who presents to the Emergency Department with family. Per family pt reported dizziness last night and when she woke up this AM she was not feeling well. At this time pt states general malaise but denies fever, nausea, cough, urinary symptoms, pain, difficulty breathing and dizziness. She reports normal PO intake. No recent medication changes. No alleviating factors noted.   Past Medical History  Diagnosis Date  . GERD (gastroesophageal reflux disease)   . Head ache   . Migraines   . Mitral valve prolapse   . Dyslipidemia   . Tobacco abuse   . Chronic pain syndrome   . Chest pain    Past Surgical History  Procedure Laterality Date  . Hysterectomy---unknown    . Cataract extraction     History reviewed. No pertinent family history. Social History  Substance Use Topics  . Smoking status: Current Every Day Smoker -- 1.00 packs/day    Types: Cigarettes  . Smokeless tobacco: None  . Alcohol Use: No   OB History    No data available     Review of Systems  Constitutional: Negative for fever.  Respiratory: Negative for cough and shortness of breath.   Gastrointestinal: Negative for nausea and vomiting.  Genitourinary: Negative for dysuria and hematuria.  Neurological: Positive for dizziness. Negative for headaches.  All other systems reviewed and are negative.   Allergies  Tylenol  Home Medications   Prior to Admission medications   Medication Sig Start Date End Date Taking?  Authorizing Provider  ALPRAZolam Prudy Feeler) 1 MG tablet Take 1 mg by mouth 4 (four) times daily as needed for anxiety.    Yes Historical Provider, MD  amLODipine (NORVASC) 5 MG tablet Take 5 mg by mouth daily.   Yes Historical Provider, MD  aspirin 81 MG tablet Take 81 mg by mouth daily.   Yes Historical Provider, MD  Calcium-Vitamin D 600-125 MG-UNIT TABS Take 2 tablets by mouth daily.   Yes Historical Provider, MD  fluticasone (FLONASE) 50 MCG/ACT nasal spray Place 1 spray into both nostrils daily.  04/28/15  Yes Historical Provider, MD  loratadine (CLARITIN) 10 MG tablet Take 10 mg by mouth daily.   Yes Historical Provider, MD  oxyCODONE-acetaminophen (PERCOCET) 10-325 MG tablet Take 1 tablet by mouth 4 (four) times daily as needed for pain.   Yes Historical Provider, MD  Pseudoephed-APAP-Guaifenesin (TYLENOL SINUS SEVERE CONGEST PO) Take 2 tablets by mouth daily as needed (sinus).   Yes Historical Provider, MD  rOPINIRole (REQUIP) 2 MG tablet Take 4 mg by mouth at bedtime.  05/29/15  Yes Historical Provider, MD  simvastatin (ZOCOR) 40 MG tablet Take 40 mg by mouth daily.   Yes Historical Provider, MD  traZODone (DESYREL) 50 MG tablet Take 50 mg by mouth at bedtime as needed for sleep.    Yes Historical Provider, MD   BP 171/90 mmHg  Pulse 88  Temp(Src) 98.5 F (36.9 C) (Oral)  Resp 16  Ht  (1.6  m)  Wt 105 lb (47.628 kg)  BMI 18.60 kg/m2  SpO2 100% Physical Exam  Constitutional: She is oriented to person, place, and time. She appears well-developed and well-nourished. No distress.  Laying in bed, appears tired  NAD  HENT:  Head: Normocephalic and atraumatic.  Eyes: Conjunctivae are normal.  Cardiovascular: Tachycardia present.   Mild tachycardia   Pulmonary/Chest: Effort normal.  Abdominal: She exhibits no distension.  Neurological: She is alert and oriented to person, place, and time.  Mild global weakness No focal motor deficit   Skin: Skin is warm and dry.  Psychiatric: She  has a normal mood and affect.  Nursing note and vitals reviewed.   ED Course  Procedures   DIAGNOSTIC STUDIES:  Oxygen Saturation is 100% on RA, normal by my interpretation.    COORDINATION OF CARE:  10:39 AM Will order blood work and UA.  Discussed treatment plan with pt at bedside and pt agreed to plan.  Labs Review Labs Reviewed  BASIC METABOLIC PANEL - Abnormal; Notable for the following:    Potassium 3.4 (*)    Glucose, Bld 119 (*)    All other components within normal limits  URINALYSIS, ROUTINE W REFLEX MICROSCOPIC (NOT AT [facility]) - Abnormal; Notable for the following:    APPearance HAZY (*)    Hgb urine dipstick SMALL (*)    All other components within normal limits  URINE MICROSCOPIC-ADD ON - Abnormal; Notable for the following:    Squamous Epithelial / LPF FEW (*)    All other components within normal limits  CBC WITH DIFFERENTIAL/PLATELET    Imaging Review No results found. I have personally reviewed and evaluated these lab results as part of my medical decision-making.   EKG Interpretation   Date/Time:  Wednesday September 30 2015 10:43:40 EDT Ventricular Rate:  71 PR Interval:  145 QRS Duration: 84 QT Interval:  408 QTC Calculation: 443 R Axis:   45 Text Interpretation:  Sinus rhythm Abnormal R-wave progression, early  transition ED PHYSICIAN INTERPRETATION AVAILABLE IN [facility]  Confirmed by TEST, Record ([id number]) on 10/01/2015 9:32:58 AM      MDM   Final diagnoses:  Dizziness   I personally preformed the services scribed in my presence. The recorded information has been reviewed is accurate. Raeford RazorStephen Ahmeer Tuman, MD.   Raeford RazorStephen Valen Gillison, MD 10/11/15 [phone]

## 2015-09-30 NOTE — Discharge Instructions (Signed)

## 2015-10-20 ENCOUNTER — Ambulatory Visit: Payer: Medicare Other | Admitting: Podiatry

## 2015-11-19 ENCOUNTER — Encounter: Payer: Self-pay | Admitting: Podiatry

## 2015-11-19 ENCOUNTER — Ambulatory Visit (INDEPENDENT_AMBULATORY_CARE_PROVIDER_SITE_OTHER): Payer: Medicare Other | Admitting: Podiatry

## 2015-11-19 VITALS — BP 138/74 | HR 78 | Resp 16

## 2015-11-19 DIAGNOSIS — M792 Neuralgia and neuritis, unspecified: Secondary | ICD-10-CM | POA: Diagnosis not present

## 2015-11-19 DIAGNOSIS — M779 Enthesopathy, unspecified: Secondary | ICD-10-CM | POA: Diagnosis not present

## 2015-11-22 NOTE — Progress Notes (Signed)
She presents today for follow-up of capsulitis second metatarsophalangeal joint she states that is doing better however still have to keep these things between my toes to even walk.   Objective: Vital signs are stable alert and oriented 3 ulcers are intact. Neurologic sensorium is intact hallux valgus deformity present. Capsulitis is resolving.   Assessment: Resolving capsulitis second metatarsophalangeal joint left foot.  Plan: discussed the need for surgical intervention regarding hallux valgus and second metatarsal osteotomy consider surgical intervention at some point in the future. Otherwise follow-up with us as needed.

## 2016-01-07 ENCOUNTER — Telehealth: Payer: Self-pay | Admitting: Vascular Surgery

## 2016-01-07 NOTE — Telephone Encounter (Signed)
Recvd a VM from pt wanting to schedule.   I LVM for her to call back and ask for [facility].

## 2016-03-17 ENCOUNTER — Emergency Department (HOSPITAL_COMMUNITY)
Admission: EM | Admit: 2016-03-17 | Discharge: 2016-03-17 | Disposition: A | Discharge status: Home / Self Care | Payer: Medicare Other | Attending: Emergency Medicine | Admitting: Emergency Medicine

## 2016-03-17 ENCOUNTER — Encounter (HOSPITAL_COMMUNITY): Payer: Self-pay

## 2016-03-17 ENCOUNTER — Emergency Department (HOSPITAL_COMMUNITY): Payer: Medicare Other

## 2016-03-17 DIAGNOSIS — F17210 Nicotine dependence, cigarettes, uncomplicated: Secondary | ICD-10-CM | POA: Insufficient documentation

## 2016-03-17 DIAGNOSIS — Z79899 Other long term (current) drug therapy: Secondary | ICD-10-CM | POA: Diagnosis not present

## 2016-03-17 DIAGNOSIS — R109 Unspecified abdominal pain: Secondary | ICD-10-CM

## 2016-03-17 DIAGNOSIS — Z7982 Long term (current) use of aspirin: Secondary | ICD-10-CM | POA: Insufficient documentation

## 2016-03-17 DIAGNOSIS — N390 Urinary tract infection, site not specified: Secondary | ICD-10-CM | POA: Insufficient documentation

## 2016-03-17 DIAGNOSIS — K5900 Constipation, unspecified: Secondary | ICD-10-CM | POA: Diagnosis not present

## 2016-03-17 DIAGNOSIS — R1011 Right upper quadrant pain: Secondary | ICD-10-CM | POA: Diagnosis present

## 2016-03-17 HISTORY — DX: Cardiac murmur, unspecified: R01.1

## 2016-03-17 LAB — CBC WITH DIFFERENTIAL/PLATELET
BASOS ABS: 0 10*3/uL (ref 0.0–0.1)
BASOS PCT: 0 %
EOS ABS: 3.6 10*3/uL — AB (ref 0.0–0.7)
Eosinophils Relative: 30 %
HEMATOCRIT: 43.1 % (ref 36.0–46.0)
HEMOGLOBIN: 14.3 g/dL (ref 12.0–15.0)
Lymphocytes Relative: 24 %
Lymphs Abs: 2.8 10*3/uL (ref 0.7–4.0)
MCH: 31.8 pg (ref 26.0–34.0)
MCHC: 33.2 g/dL (ref 30.0–36.0)
MCV: 96 fL (ref 78.0–100.0)
MONOS PCT: 5 %
Monocytes Absolute: 0.6 10*3/uL (ref 0.1–1.0)
NEUTROS ABS: 4.8 10*3/uL (ref 1.7–7.7)
NEUTROS PCT: 41 %
PLATELETS: 245 10*3/uL (ref 150–400)
RBC: 4.49 MIL/uL (ref 3.87–5.11)
RDW: 14 % (ref 11.5–15.5)
WBC: 11.9 10*3/uL — AB (ref 4.0–10.5)

## 2016-03-17 LAB — COMPREHENSIVE METABOLIC PANEL
ALT: 18 U/L (ref 14–54)
ANION GAP: 9 (ref 5–15)
AST: 27 U/L (ref 15–41)
Albumin: 4.1 g/dL (ref 3.5–5.0)
Alkaline Phosphatase: 75 U/L (ref 38–126)
BUN: 9 mg/dL (ref 6–20)
CHLORIDE: 103 mmol/L (ref 101–111)
CO2: 27 mmol/L (ref 22–32)
CREATININE: 0.91 mg/dL (ref 0.44–1.00)
Calcium: 9.2 mg/dL (ref 8.9–10.3)
Glucose, Bld: 125 mg/dL — ABNORMAL HIGH (ref 65–99)
POTASSIUM: 3.4 mmol/L — AB (ref 3.5–5.1)
SODIUM: 139 mmol/L (ref 135–145)
Total Bilirubin: 0.5 mg/dL (ref 0.3–1.2)
Total Protein: 7.2 g/dL (ref 6.5–8.1)

## 2016-03-17 LAB — URINALYSIS, ROUTINE W REFLEX MICROSCOPIC
BILIRUBIN URINE: NEGATIVE
Glucose, UA: NEGATIVE mg/dL
KETONES UR: NEGATIVE mg/dL
NITRITE: NEGATIVE
PH: 7 (ref 5.0–8.0)
PROTEIN: NEGATIVE mg/dL
Specific Gravity, Urine: 1.015 (ref 1.005–1.030)

## 2016-03-17 LAB — URINE MICROSCOPIC-ADD ON
BACTERIA UA: NONE SEEN
SQUAMOUS EPITHELIAL / LPF: NONE SEEN

## 2016-03-17 LAB — POC OCCULT BLOOD, ED: FECAL OCCULT BLD: NEGATIVE

## 2016-03-17 LAB — LIPASE, BLOOD: LIPASE: 21 U/L (ref 11–51)

## 2016-03-17 MED ORDER — ONDANSETRON HCL 4 MG/2ML IJ SOLN
4.0000 mg | Freq: Once | INTRAMUSCULAR | Status: AC
  Administered 2016-03-17: 4 mg via INTRAVENOUS
  Filled 2016-03-17: qty 2

## 2016-03-17 MED ORDER — PROMETHAZINE HCL 12.5 MG PO TABS
12.5000 mg | ORAL_TABLET | Freq: Once | ORAL | Status: AC
  Administered 2016-03-17: 12.5 mg via ORAL
  Filled 2016-03-17: qty 1

## 2016-03-17 MED ORDER — CEPHALEXIN 500 MG PO CAPS: 500.0000 mg | ORAL_CAPSULE | Freq: Four times a day (QID) | ORAL | Status: DC

## 2016-03-17 MED ORDER — IOPAMIDOL (ISOVUE-300) INJECTION 61%
100.0000 mL | Freq: Once | INTRAVENOUS | Status: AC | PRN
  Administered 2016-03-17: 100 mL via INTRAVENOUS

## 2016-03-17 MED ORDER — PROMETHAZINE HCL 12.5 MG PO TABS: 12.5000 mg | ORAL_TABLET | Freq: Four times a day (QID) | ORAL | Status: DC | PRN

## 2016-03-17 MED ORDER — ACETAMINOPHEN 500 MG PO TABS
1000.0000 mg | ORAL_TABLET | Freq: Once | ORAL | Status: AC
  Administered 2016-03-17: 1000 mg via ORAL
  Filled 2016-03-17: qty 2

## 2016-03-17 MED ORDER — DIATRIZOATE MEGLUMINE & SODIUM 66-10 % PO SOLN
ORAL | Status: DC
Start: 2016-03-17 — End: 2016-03-17
  Filled 2016-03-17: qty 30

## 2016-03-17 NOTE — ED Notes (Signed)
Pt resting with husband at bedside.  Denies any complaints.  Call light in reach.

## 2016-03-17 NOTE — ED Notes (Signed)
Pt reports for the past 3 weeks has had pain, n/v/d after eating.  Reports symptoms have been worse the past few days.

## 2016-03-17 NOTE — ED Provider Notes (Signed)
CSN: [id number]     Arrival date & time 03/17/16  0759 History   First MD Initiated Contact with Patient 03/17/16 [id number]     Chief Complaint  Patient presents with  . Abdominal Pain     (Consider location/radiation/quality/duration/timing/severity/associated sxs/prior Treatment) HPI Comments: Patient is a 70 year old female who presents to the emergency department with abdominal pain.  The patient states that over the past 3 weeks she's been having more and more pain in her abdomen. This pain is usually in the right upper quadrant and epigastric area. The pain is aggravated by eating. She states she has been able to eat oatmeal, without much pain, but if she eats anything else, particularly fried foods or pizza she has nausea, vomiting, diarrhea, and discomfort. The patient also complains of a burning sensation when urine that started this morning and is accompanied by some back pain. She states that she has chronic back pain as well. She presents now for evaluation. She is very concerned she may be having gallbladder issues, several members of her family have had similar symptoms were related to gallbladder disease.  The history is provided by the patient.    Past Medical History  Diagnosis Date  . GERD (gastroesophageal reflux disease)   . Head ache   . Migraines   . Mitral valve prolapse   . Dyslipidemia   . Tobacco abuse   . Chronic pain syndrome   . Chest pain   . Heart murmur    Past Surgical History  Procedure Laterality Date  . Hysterectomy---unknown    . Cataract extraction    . Carpal tunnel release    . Bunyunectomy     No family history on file. Social History  Substance Use Topics  . Smoking status: Current Every Day Smoker -- 1.00 packs/day    Types: Cigarettes  . Smokeless tobacco: None  . Alcohol Use: No   OB History    No data available     Review of Systems  Gastrointestinal: Positive for nausea, vomiting, abdominal pain and diarrhea.  Genitourinary:  Positive for dysuria. Negative for vaginal bleeding and vaginal discharge.  Musculoskeletal: Positive for back pain and arthralgias.  Neurological: Positive for headaches.  All other systems reviewed and are negative.     Allergies  Nyquil multi-symptom  Home Medications   Prior to Admission medications   Medication Sig Start Date End Date Taking? Authorizing Provider  ALPRAZolam Prudy Feeler) 1 MG tablet Take 1 mg by mouth 4 (four) times daily as needed for anxiety.    Yes Historical Provider, MD  amLODipine (NORVASC) 5 MG tablet Take 5 mg by mouth daily.   Yes Historical Provider, MD  aspirin 81 MG tablet Take 81 mg by mouth daily.   Yes Historical Provider, MD  Calcium-Vitamin D 600-125 MG-UNIT TABS Take 2 tablets by mouth daily.   Yes Historical Provider, MD  Cholecalciferol (VITAMIN D PO) Take 1,000 mg by mouth daily.   Yes Historical Provider, MD  gabapentin (NEURONTIN) 300 MG capsule Take 600 mg by mouth at bedtime.  02/03/16  Yes Historical Provider, MD  loratadine (CLARITIN) 10 MG tablet Take 10 mg by mouth daily.   Yes Historical Provider, MD  Magnesium 400 MG CAPS Take 1 tablet by mouth daily.   Yes Historical Provider, MD  oxyCODONE-acetaminophen (PERCOCET) 10-325 MG tablet Take 1 tablet by mouth 4 (four) times daily as needed for pain.   Yes Historical Provider, MD  rOPINIRole (REQUIP) 2 MG tablet Take 4 mg by  mouth at bedtime.  05/29/15  Yes Historical Provider, MD  simvastatin (ZOCOR) 40 MG tablet Take 40 mg by mouth daily.   Yes Historical Provider, MD  traZODone (DESYREL) 50 MG tablet Take 50 mg by mouth at bedtime as needed for sleep.    Yes Historical Provider, MD   BP 130/80 mmHg  Pulse 80  Temp(Src) 98.3 F (36.8 C) (Oral)  Resp 18  Ht  (1.549 m)  Wt 50.803 kg  BMI 21.17 kg/m2  SpO2 100% Physical Exam  Constitutional: She is oriented to person, place, and time. She appears well-developed and well-nourished.  Non-toxic appearance.  HENT:  Head: Normocephalic.   Right Ear: Tympanic membrane and external ear normal.  Left Ear: Tympanic membrane and external ear normal.  Eyes: EOM and lids are normal. Pupils are equal, round, and reactive to light.  Neck: Normal range of motion. Neck supple. Carotid bruit is not present.  Cardiovascular: Normal rate, regular rhythm, normal heart sounds, intact distal pulses and normal pulses.   Pulmonary/Chest: Breath sounds normal. No respiratory distress.  Abdominal: Soft. Bowel sounds are normal. She exhibits no distension, no ascites and no pulsatile midline mass. There is no splenomegaly or hepatomegaly. There is tenderness in the right upper quadrant and epigastric area. There is no rebound and no guarding.  Musculoskeletal: Normal range of motion.  Lymphadenopathy:       Head (right side): No submandibular adenopathy present.       Head (left side): No submandibular adenopathy present.    She has no cervical adenopathy.  Neurological: She is alert and oriented to person, place, and time. She has normal strength. No cranial nerve deficit or sensory deficit.  Skin: Skin is warm and dry.  Psychiatric: She has a normal mood and affect. Her speech is normal.  Nursing note and vitals reviewed.   ED Course  Procedures (including critical care time) Labs Review Labs Reviewed  URINALYSIS, ROUTINE W REFLEX MICROSCOPIC (NOT AT [facility]) - Abnormal; Notable for the following:    Hgb urine dipstick MODERATE (*)    Leukocytes, UA TRACE (*)    All other components within normal limits  CBC WITH DIFFERENTIAL/PLATELET - Abnormal; Notable for the following:    WBC 11.9 (*)    Eosinophils Absolute 3.6 (*)    All other components within normal limits  COMPREHENSIVE METABOLIC PANEL - Abnormal; Notable for the following:    Potassium 3.4 (*)    Glucose, Bld 125 (*)    All other components within normal limits  URINE CULTURE  LIPASE, BLOOD  URINE MICROSCOPIC-ADD ON  POC OCCULT BLOOD, ED  POC OCCULT BLOOD, ED    Imaging  Review US Abdomen Limited  03/17/2016  CLINICAL DATA:  Right upper quadrant and epigastric abdominal pain, nausea, and vomiting for 3 weeks. EXAM: US ABDOMEN LIMITED - RIGHT UPPER QUADRANT COMPARISON:  None. FINDINGS: Gallbladder: No gallstones or wall thickening visualized. No sonographic Murphy sign noted by sonographer. Common bile duct: Diameter: 3 mm Liver: No focal lesion identified. Within normal limits in parenchymal echogenicity. IMPRESSION: Unremarkable right upper quadrant ultrasound. Electronically Signed   By: Sebastian Ache M.D.   On: 03/17/2016 10:06   I have personally reviewed and evaluated these images and lab results as part of my medical decision-making.   EKG Interpretation None      MDM   Lipase is within normal limits at 21. Doubt pancreatitis. Stool for occult blood is negative. Complete blood count shows a slight elevation of the  white blood cells at 11,900, there is no shift to the left. The complete blood count is otherwise within normal limits. The comprehensive metabolic panel shows potassium to be slightly low at 3.4, otherwise within normal limits. The anion gap is 9. Urinalysis shows a clear yellow specimen with a specific gravity of 1.015. There is moderate hemoglobin on the dipped urine, as well as trace leukocyte esterase. Microscopic evaluation shows 0-5 white blood cells, and 6-30 red cells.  Right upper quadrant abdominal ultrasound is negative for acute problems, in particular there no evidence of gallstones, or gallbladder wall thickening. Will proceed with CT scan of the abdomen. The laboratory and ultrasound findings were discussed with the patient and the spouse in terms which they understand. Questions were answered.  Patient developed nausea and vomiting when attempting to take the contrast. IV Zofran given to the patient.    Final diagnoses:  Abdominal pain, unspecified abdominal location  UTI (lower urinary tract infection)  Constipation,  unspecified constipation type    *I have reviewed nursing notes, vital signs, and all appropriate lab and imaging results for this patient.[address]    Trevon Strothers, PA-C 03/18/16 [id number]  Donnetta HutchingBrian Cook, MD 03/18/16 [phone]

## 2016-03-17 NOTE — ED Notes (Signed)
Pt reports her pain is in RUQ.  Pt also c/o burning with urination that started this morning and back pain.

## 2016-03-17 NOTE — Discharge Instructions (Signed)
Your ultra sound, and CT scan are negative for acute problem. Your lab test question a urinary tract infection, but other wise negative. Please use promethazine for nausea/vomiting. Use keflex for the infection. Please increase water, juice and gatorade to improve hydration. Your scan suggest constipation. Please increase leafy greens and salads. Increase fiber in the diet. Chocolate pudding may also helpful. Please see Dr Jena Gaussourk for GI evaluation next week. High-Fiber Diet Fiber, also called dietary fiber, is a type of carbohydrate found in fruits, vegetables, whole grains, and beans. A high-fiber diet can have many health benefits. Your health care provider may recommend a high-fiber diet to help:  Prevent constipation. Fiber can make your bowel movements more regular.  Lower your cholesterol.  Relieve hemorrhoids, uncomplicated diverticulosis, or irritable bowel syndrome.  Prevent overeating as part of a weight-loss plan.  Prevent heart disease, type 2 diabetes, and certain cancers. WHAT IS MY PLAN? The recommended daily intake of fiber includes:  38 grams for men under age 70.  30 grams for men over age 450.  25 grams for women under age 70.  21 grams for women over age 70. You can get the recommended daily intake of dietary fiber by eating a variety of fruits, vegetables, grains, and beans. Your health care provider may also recommend a fiber supplement if it is not possible to get enough fiber through your diet. WHAT DO I NEED TO KNOW ABOUT A HIGH-FIBER DIET?  Fiber supplements have not been widely studied for their effectiveness, so it is better to get fiber through food sources.  Always check the fiber content on thenutrition facts label of any prepackaged food. Look for foods that contain at least 5 grams of fiber per serving.  Ask your dietitian if you have questions about specific foods that are related to your condition, especially if those foods are not listed in the  following section.  Increase your daily fiber consumption gradually. Increasing your intake of dietary fiber too quickly may cause bloating, cramping, or gas.  Drink plenty of water. Water helps you to digest fiber. WHAT FOODS CAN I EAT? Grains Whole-grain breads. Multigrain cereal. Oats and oatmeal. Brown rice. Barley. Bulgur wheat. Millet. Bran muffins. Popcorn. Rye wafer crackers. Vegetables Sweet potatoes. Spinach. Kale. Artichokes. Cabbage. Broccoli. Green peas. Carrots. Squash. Fruits Berries. Pears. Apples. Oranges. Avocados. Prunes and raisins. Dried figs. Meats and Other Protein Sources Navy, kidney, pinto, and soy beans. Split peas. Lentils. Nuts and seeds. Dairy Fiber-fortified yogurt. Beverages Fiber-fortified soy milk. Fiber-fortified orange juice. Other Fiber bars. The items listed above may not be a complete list of recommended foods or beverages. Contact your dietitian for more options. WHAT FOODS ARE NOT RECOMMENDED? Grains White bread. Pasta made with refined flour. White rice. Vegetables Fried potatoes. Canned vegetables. Well-cooked vegetables.  Fruits Fruit juice. Cooked, strained fruit. Meats and Other Protein Sources Fatty cuts of meat. Fried Environmental education officerpoultry or fried fish. Dairy Milk. Yogurt. Cream cheese. Sour cream. Beverages Soft drinks. Other Cakes and pastries. Butter and oils. The items listed above may not be a complete list of foods and beverages to avoid. Contact your dietitian for more information. WHAT ARE SOME TIPS FOR INCLUDING HIGH-FIBER FOODS IN MY DIET?  Eat a wide variety of high-fiber foods.  Make sure that half of all grains consumed each day are whole grains.  Replace breads and cereals made from refined flour or white flour with whole-grain breads and cereals.  Replace white rice with brown rice, bulgur wheat, or millet.  Start the day with a breakfast that is high in fiber, such as a cereal that contains at least 5 grams of fiber per  serving.  Use beans in place of meat in soups, salads, or pasta.  Eat high-fiber snacks, such as berries, raw vegetables, nuts, or popcorn.   This information is not intended to replace advice given to you by your health care provider. Make sure you discuss any questions you have with your health care provider.   Document Released: 11/21/2005 Document Revised: 12/12/2014 Document Reviewed: 05/06/2014 Elsevier Interactive Patient Education 2016 Elsevier Inc.  Urinary Tract Infection A urinary tract infection (UTI) can occur any place along the urinary tract. The tract includes the kidneys, ureters, bladder, and urethra. A type of germ called bacteria often causes a UTI. UTIs are often helped with antibiotic medicine.  HOME CARE   If given, take antibiotics as told by your doctor. Finish them even if you start to feel better.  Drink enough fluids to keep your pee (urine) clear or pale yellow.  Avoid tea, drinks with caffeine, and bubbly (carbonated) drinks.  Pee often. Avoid holding your pee in for a long time.  Pee before and after having sex (intercourse).  Wipe from front to back after you poop (bowel movement) if you are a woman. Use each tissue only once. GET HELP RIGHT AWAY IF:   You have back pain.  You have lower belly (abdominal) pain.  You have chills.  You feel sick to your stomach (nauseous).  You throw up (vomit).  Your burning or discomfort with peeing does not go away.  You have a fever.  Your symptoms are not better in 3 days. MAKE SURE YOU:   Understand these instructions.  Will watch your condition.  Will get help right away if you are not doing well or get worse.   This information is not intended to replace advice given to you by your health care provider. Make sure you discuss any questions you have with your health care provider.   Document Released: 05/09/2008 Document Revised: 12/12/2014 Document Reviewed: 06/21/2012 Elsevier Interactive  Patient Education 2016 Elsevier Inc.  Abdominal Pain, Adult Many things can cause belly (abdominal) pain. Most times, the belly pain is not dangerous. Many cases of belly pain can be watched and treated at home. HOME CARE   Do not take medicines that help you go poop (laxatives) unless told to by your doctor.  Only take medicine as told by your doctor.  Eat or drink as told by your doctor. Your doctor will tell you if you should be on a special diet. GET HELP IF:  You do not know what is causing your belly pain.  You have belly pain while you are sick to your stomach (nauseous) or have runny poop (diarrhea).  You have pain while you pee or poop.  Your belly pain wakes you up at night.  You have belly pain that gets worse or better when you eat.  You have belly pain that gets worse when you eat fatty foods.  You have a fever. GET HELP RIGHT AWAY IF:   The pain does not go away within 2 hours.  You keep throwing up (vomiting).  The pain changes and is only in the right or left part of the belly.  You have bloody or tarry looking poop. MAKE SURE YOU:   Understand these instructions.  Will watch your condition.  Will get help right away if you are not doing  well or get worse.   This information is not intended to replace advice given to you by your health care provider. Make sure you discuss any questions you have with your health care provider.   Document Released: 05/09/2008 Document Revised: 12/12/2014 Document Reviewed: 07/31/2013 Elsevier Interactive Patient Education [employer]

## 2016-03-17 NOTE — ED Notes (Signed)
Pt transported to CT via stretcher.  

## 2016-03-18 LAB — URINE CULTURE: CULTURE: NO GROWTH

## 2016-04-11 ENCOUNTER — Ambulatory Visit: Payer: Medicare Other | Admitting: Nurse Practitioner

## 2016-04-28 ENCOUNTER — Encounter: Payer: Self-pay | Admitting: Nurse Practitioner

## 2016-04-28 ENCOUNTER — Ambulatory Visit (INDEPENDENT_AMBULATORY_CARE_PROVIDER_SITE_OTHER): Payer: Medicare Other | Admitting: Nurse Practitioner

## 2016-04-28 VITALS — BP 137/87 | HR 83 | Temp 97.6°F | Ht 62.0 in | Wt 116.0 lb

## 2016-04-28 DIAGNOSIS — G8929 Other chronic pain: Secondary | ICD-10-CM | POA: Diagnosis not present

## 2016-04-28 DIAGNOSIS — R1010 Upper abdominal pain, unspecified: Secondary | ICD-10-CM

## 2016-04-28 DIAGNOSIS — K219 Gastro-esophageal reflux disease without esophagitis: Secondary | ICD-10-CM | POA: Diagnosis not present

## 2016-04-28 DIAGNOSIS — K5900 Constipation, unspecified: Secondary | ICD-10-CM

## 2016-04-28 DIAGNOSIS — R1011 Right upper quadrant pain: Secondary | ICD-10-CM

## 2016-04-28 MED ORDER — OMEPRAZOLE 20 MG PO CPDR: 20.0000 mg | DELAYED_RELEASE_CAPSULE | Freq: Every day | ORAL | Status: AC

## 2016-04-28 NOTE — Progress Notes (Signed)
Primary Care Physician:  Remus LofflerJones, Angel S, PA-C Primary Gastroenterologist:  Dr. Darrick PennaFields  Chief Complaint  Patient presents with  . Abdominal Pain    since November  ED referral    HPI:   Amy Santos is a 70 y.o. female who presents On referral from the emergency department for abdominal pain. Patient was seen in the ED 03/17/2016 which is described as progressive over the previous 3 weeks, located right upper quadrant and epigastric and exacerbated by eating. Has been able to tolerate a meal without problems, but any other solid foods cause problems. Also associated with nausea, vomiting, diarrhea. CBC, white blood cell count of 11.9 without left shift, lipase normal. Stool is heme-negative. Right upper quadrant abdominal ultrasound was noted to be negative for acute process, specifically no gallstones or gallbladder wall thickness. CT abdomen and pelvis found no evidence of acute inflammatory process within the abdomen or pelvis, no bowel obstruction, normal appendix, no pericecal inflammation. Moderate stool in the distal sigmoid and rectum with rectum distended 7 cm with stool, possible mild fecal impaction. No history of colonoscopy or endoscopy in our system.  Today she states she is still having abdominal pain. States it feels like gallstone, has a lot of abdominal pain and N/V after eating. Saw ER and "they couldn't find anything." Symptoms worse at night after dinner. Will go 2-3 days without a bowel movement and then have diarrhea all day. Pain, N/V nearly daily. Eating mostly oatmeal, whole wheat breat, yogurt. Still has symptoms despite that. Pain is epigastric, "just a hurt" and unable to characterize further. Also with excessive fluctuance. Also has some esophageal burning and bitter sour taste that precedes her vomiting. Denies hematochezia, melena, unintentional weight loss, fever, chills. Denies chest pain, dyspnea, dizziness, lightheadedness, syncope, near syncope. Denies  any other upper or lower GI symptoms.  Last colonoscopy done by Dr. Troy SineMedhoff in [place], typically has one every 5 years.  Past Medical History  Diagnosis Date  . GERD (gastroesophageal reflux disease)   . Head ache   . Migraines   . Mitral valve prolapse   . Dyslipidemia   . Tobacco abuse   . Chronic pain syndrome   . Chest pain   . Heart murmur     Past Surgical History  Procedure Laterality Date  . Hysterectomy---unknown    . Cataract extraction    . Carpal tunnel release    . Bunyunectomy      Current Outpatient Prescriptions  Medication Sig Dispense Refill  . ALPRAZolam (XANAX) 1 MG tablet Take 1 mg by mouth 4 (four) times daily as needed for anxiety.     Marland Kitchen. amLODipine (NORVASC) 5 MG tablet Take 5 mg by mouth daily.    Marland Kitchen. aspirin 81 MG tablet Take 81 mg by mouth daily.    . Calcium-Vitamin D 600-125 MG-UNIT TABS Take 2 tablets by mouth daily.    . Cholecalciferol (VITAMIN D PO) Take 1,000 mg by mouth daily.    Marland Kitchen. gabapentin (NEURONTIN) 300 MG capsule Take 600 mg by mouth at bedtime.     Marland Kitchen. loratadine (CLARITIN) 10 MG tablet Take 10 mg by mouth daily.    . Magnesium 400 MG CAPS Take 1 tablet by mouth daily.    Marland Kitchen. oxyCODONE-acetaminophen (PERCOCET) 10-325 MG tablet Take 1 tablet by mouth 4 (four) times daily as needed for pain.    . promethazine (PHENERGAN) 12.5 MG tablet Take 1 tablet (12.5 mg total) by mouth every 6 (six) hours as needed  for nausea or vomiting. 12 tablet 0  . rOPINIRole (REQUIP) 2 MG tablet Take 4 mg by mouth at bedtime.     . simvastatin (ZOCOR) 40 MG tablet Take 40 mg by mouth daily.    . traZODone (DESYREL) 50 MG tablet Take 50 mg by mouth at bedtime as needed for sleep.     Marland Kitchen omeprazole (PRILOSEC) 20 MG capsule Take 1 capsule (20 mg total) by mouth daily. 90 capsule 3   No current facility-administered medications for this visit.    Allergies as of 04/28/2016 - Review Complete 04/28/2016  Allergen Reaction Noted  . Nyquil multi-symptom  [pseudoeph-doxylamine-dm-apap]  03/17/2016    Family History  Problem Relation Age of Onset  . Colon cancer Neg Hx     Social History   Social History  . Marital Status: Married    Spouse Name: N/A  . Number of Children: N/A  . Years of Education: N/A   Occupational History  . retired    Social History Main Topics  . Smoking status: Current Every Day Smoker -- 0.75 packs/day    Types: Cigarettes  . Smokeless tobacco: Never Used  . Alcohol Use: No  . Drug Use: No  . Sexual Activity: Not on file   Other Topics Concern  . Not on file   Social History Narrative    Review of Systems: 10-point ROS negative except as per HPI.    Physical Exam: BP 137/87 mmHg  Pulse 83  Temp(Src) 97.6 F (36.4 C) (Oral)  Ht  (1.575 m)  Wt 116 lb (52.617 kg)  BMI 21.21 kg/m2 General:   Alert and oriented. Pleasant and cooperative. Well-nourished and well-developed.  Head:  Normocephalic and atraumatic. Eyes:  Without icterus, sclera clear and conjunctiva pink.  Ears:  Normal auditory acuity. Cardiovascular:  S1, S2 present without murmurs appreciated. Extremities without clubbing or edema. Respiratory:  Clear to auscultation bilaterally. No wheezes, rales, or rhonchi. No distress.  Gastrointestinal:  +BS, soft, non-tender and non-distended. No HSM noted. No guarding or rebound. No masses appreciated.  Rectal:  Deferred  Musculoskalatal:  Symmetrical without gross deformities. Neurologic:  Alert and oriented x4;  grossly normal neurologically. Psych:  Alert and cooperative. Normal mood and affect. Heme/Lymph/Immune: No excessive bruising noted.    04/28/2016 11:51 AM   Disclaimer: This note was dictated with voice recognition software. Similar sounding words can inadvertently be transcribed and may not be corrected upon review.

## 2016-04-28 NOTE — Assessment & Plan Note (Signed)
Abdominal pain is epigastric and occasionally right upper quadrant. Worse with foods. We'll start on omeprazole as noted above. If symptoms persist can consider HIDA scan for functional gallbladder disease. Return for follow-up in 6 weeks

## 2016-04-28 NOTE — Patient Instructions (Signed)
1. Take omeprazole 20 mg once a day, 30 mins before your first meal of the day 2. Have your XRay done in the next day or two 3. Take Amitiza 24 mcg twice daily. Call and let us know how it's working. 4. Return for follow-up in 6 weeks.

## 2016-04-28 NOTE — Assessment & Plan Note (Signed)
Symptoms on very indicative of GERD exacerbations. We'll start her on omeprazole 20 mg once a day. Return for follow-up in 6 weeks.

## 2016-04-28 NOTE — Assessment & Plan Note (Signed)
Patient with distended rectum on CT scan likely constipation with overflow diarrhea. This point I'll order abdominal x-ray to reassess for stool burden. Is on pain medications and likely opioid-induced constipation I will start Amitiza 24 g twice a day with food. She is to call us in 2 weeks and let us know how is working. Return for follow-up in 6 weeks.

## 2016-05-03 ENCOUNTER — Telehealth: Payer: Self-pay | Admitting: Gastroenterology

## 2016-05-03 NOTE — Telephone Encounter (Signed)
Pt called to say that she had seen EG last Thursday and was to be scheduled for an xray. [facility] and [facility] told her that they didn't have any orders on her to have an xray. Please advise and call patient at [phone]

## 2016-05-03 NOTE — Progress Notes (Signed)
CC'D TO PCP °

## 2016-05-03 NOTE — Telephone Encounter (Signed)
Pt is aware that the order for the xray is in the computer

## 2016-05-05 ENCOUNTER — Ambulatory Visit (HOSPITAL_COMMUNITY)
Admission: RE | Admit: 2016-05-05 | Discharge: 2016-05-05 | Disposition: A | Discharge status: Home / Self Care | Payer: Medicare Other | Source: Ambulatory Visit | Attending: Nurse Practitioner | Admitting: Nurse Practitioner

## 2016-05-05 DIAGNOSIS — R1010 Upper abdominal pain, unspecified: Secondary | ICD-10-CM | POA: Insufficient documentation

## 2016-05-05 DIAGNOSIS — G8929 Other chronic pain: Secondary | ICD-10-CM | POA: Insufficient documentation

## 2016-05-05 DIAGNOSIS — K219 Gastro-esophageal reflux disease without esophagitis: Secondary | ICD-10-CM | POA: Diagnosis present

## 2016-05-05 DIAGNOSIS — K5900 Constipation, unspecified: Secondary | ICD-10-CM | POA: Insufficient documentation

## 2016-05-05 DIAGNOSIS — R1011 Right upper quadrant pain: Secondary | ICD-10-CM

## 2016-05-09 ENCOUNTER — Telehealth: Payer: Self-pay | Admitting: Gastroenterology

## 2016-05-09 NOTE — Telephone Encounter (Signed)
SAMPLES OF AMITEZA WORKED AND WOULD LIKE A PRESCRIPTION CALLED INTO DRUG STORE IN [place]. ALSO GIVEN PHENAGRIN IN THE HOSPITAL FOR HER STOMACH AND WANTS TO KNOW IF ERIC CAN CALL HER IN THAT ALSO  PLEASE ADVISE  [phone]

## 2016-05-09 NOTE — Telephone Encounter (Signed)
Sending to refill box.  

## 2016-05-11 MED ORDER — PROMETHAZINE HCL 12.5 MG PO TABS: 12.5000 mg | ORAL_TABLET | Freq: Four times a day (QID) | ORAL | Status: DC | PRN

## 2016-05-11 NOTE — Telephone Encounter (Signed)
LMOM to call. The Rx for the phenergan printed and I have faxed to [facility].

## 2016-05-11 NOTE — Addendum Note (Signed)
Addended by: Delane GingerGILL, ERIC A on: 05/11/2016 01:24 PM   Modules accepted: Orders

## 2016-05-11 NOTE — Telephone Encounter (Signed)
Pt is aware but she needs it to go to [facility] [place]

## 2016-05-11 NOTE — Telephone Encounter (Signed)
PT's daughter, Amy Santos, called this Am and said she called on Monday for Rx for Amitiza and Phenergan. ( It was sent to refill box).  The note on Monday day said that the samples of the Amitiza worked, and she wanted a prescription. Today the daughter tells me that she has had diarrhea ever since she started the Amitiza. I told her NOT to take anymore until she gets further instructions.  Please advise for the Amitiza and phenergan.

## 2016-05-11 NOTE — Telephone Encounter (Signed)
Have her hold the Amitiza until diarrhea resolves. If it persists even without Amitiza for more then 2-3 days, call us back. Regardless, call back in a few days to let us know how she's doing. We can decide at that point what to do about constipation. I will send in Rx for Phenergan.

## 2016-05-11 NOTE — Telephone Encounter (Signed)
PT was faxed to [facility].

## 2016-05-17 NOTE — Progress Notes (Signed)
Quick Note:  Pt is aware. Said she is doing better. She is eating some prunes and Activa daily. No abdominal pain, but still stays a little sick at her stomach. She is having Bm's. But will miss a day. Please advise! ______

## 2016-05-20 ENCOUNTER — Other Ambulatory Visit: Payer: Self-pay | Admitting: Nurse Practitioner

## 2016-05-20 MED ORDER — ONDANSETRON HCL 4 MG PO TABS: 4.0000 mg | ORAL_TABLET | Freq: Three times a day (TID) | ORAL | Status: AC | PRN

## 2016-05-23 NOTE — Progress Notes (Signed)
Quick Note:  Pt called and is aware. She said the Miralax never worked for her. She would like Rx for the Amitiza 24 mcg sent to the pharmacy for her and she said she will try to take only one a day and see if that helps without being too much. Please advise! ______

## 2016-05-23 NOTE — Progress Notes (Signed)
Quick Note:  LMOM to call. ______ 

## 2016-05-24 ENCOUNTER — Other Ambulatory Visit: Payer: Self-pay | Admitting: Nurse Practitioner

## 2016-05-26 ENCOUNTER — Telehealth: Payer: Self-pay | Admitting: Nurse Practitioner

## 2016-05-26 MED ORDER — LUBIPROSTONE 24 MCG PO CAPS: 24.0000 ug | ORAL_CAPSULE | Freq: Every day | ORAL | Status: DC

## 2016-05-26 NOTE — Telephone Encounter (Signed)
LMOM the Rx was sent in and how to take it and to call with progress report in 1-2 weeks and call if questions.

## 2016-05-26 NOTE — Progress Notes (Signed)
Quick Note:  See other note. Eric sent in Rx for Amitiza 24 mcg to take one daily and let us know in 1-2 weeks how she is doing. I called and left the full message on her Vm since she identified herself on the machine. ______

## 2016-05-26 NOTE — Telephone Encounter (Signed)
-----   Message from Lavena Bullionoris H Stewart, LPN sent at 4/09/81196/19/2017  4:58 PM EDT ----- Pt called and is aware. She said the Miralax never worked for her. She would like Rx for the Amitiza 24 mcg sent to the pharmacy for her and she said she will try to take only one a day and see if that helps without being too much. Please advise!

## 2016-05-26 NOTE — Telephone Encounter (Signed)
I've sent Amitiza 24 mcg to try once a day. Call with progress report in about 1-2 weeks.

## 2016-06-16 ENCOUNTER — Ambulatory Visit (INDEPENDENT_AMBULATORY_CARE_PROVIDER_SITE_OTHER): Payer: Medicare Other | Admitting: Nurse Practitioner

## 2016-06-16 ENCOUNTER — Encounter: Payer: Self-pay | Admitting: Nurse Practitioner

## 2016-06-16 VITALS — BP 125/72 | HR 74 | Temp 98.8°F | Ht 62.0 in | Wt 118.4 lb

## 2016-06-16 DIAGNOSIS — R143 Flatulence: Secondary | ICD-10-CM | POA: Insufficient documentation

## 2016-06-16 DIAGNOSIS — K219 Gastro-esophageal reflux disease without esophagitis: Secondary | ICD-10-CM | POA: Diagnosis not present

## 2016-06-16 DIAGNOSIS — K5900 Constipation, unspecified: Secondary | ICD-10-CM | POA: Diagnosis not present

## 2016-06-16 DIAGNOSIS — R1010 Upper abdominal pain, unspecified: Secondary | ICD-10-CM | POA: Diagnosis not present

## 2016-06-16 DIAGNOSIS — G8929 Other chronic pain: Secondary | ICD-10-CM

## 2016-06-16 DIAGNOSIS — R1011 Right upper quadrant pain: Secondary | ICD-10-CM

## 2016-06-16 NOTE — Assessment & Plan Note (Signed)
Her biggest complaint at this time is excessive gas and associated discomfort. She can try simethicone over-the-counter. Call with any worsening or severe symptoms. Return for follow-up in 3 months.

## 2016-06-16 NOTE — Progress Notes (Signed)
Referring Provider: Remus LofflerJones, Angel S, PA Primary Care Physician:  Remus LofflerJones, Angel S, PA Primary GI:  Dr. Darrick PennaFields  Chief Complaint  Patient presents with  . Abdominal Pain  . Constipation    HPI:   Amy Santos is a 70 y.o. female who presents for follow-up on abdominal pain, constipation, GERD. She was last seen 04/28/2016 on referral from the emergency department for abdominal pain with imaging including right upper quadrant ultrasound and CT abdomen and pelvis both without acute findings. Did note moderate stool with possible mild fecal impaction. Her symptoms deemed they're indicative of GERD exacerbation and was started on omeprazole 20 mg once a day. If symptoms persist, recommended follow-up with a HIDA scan for functional gallbladder disease. Abdominal x-ray was ordered to assess for stool burden and she was started on Amitiza 24 g twice a day for likely opioid-induced constipation.  She called on 05/09/2016 stating and Amitiza worked well and would like a prescription which was sent to her pharmacy. Shortly thereafter she called complaining of diarrhea at which point her Amitiza was held and when her constipation returned she was started back on Amitiza once a day.  Today she states high fiber diet gives her gas. Take Amitiza on an "as needed" basis. Otherwise eats Activia yogurt and prunes in the evening. Still with some constipation. Drinks lots of water, minimal fiber intake. First stool of the day is hard. Has subsequent stools which are softer. GERD symptoms are well controlled on PPI. Denies abdominal pain, N/V, hematochezia, melena.   Past Medical History  Diagnosis Date  . GERD (gastroesophageal reflux disease)   . Head ache   . Migraines   . Mitral valve prolapse   . Dyslipidemia   . Tobacco abuse   . Chronic pain syndrome   . Chest pain   . Heart murmur     Past Surgical History  Procedure Laterality Date  . Hysterectomy---unknown    . Cataract extraction     . Carpal tunnel release    . Bunyunectomy      Current Outpatient Prescriptions  Medication Sig Dispense Refill  . ALPRAZolam (XANAX) 1 MG tablet Take 1 mg by mouth 4 (four) times daily as needed for anxiety.     Marland Kitchen. amLODipine (NORVASC) 5 MG tablet Take 5 mg by mouth daily.    Marland Kitchen. aspirin 81 MG tablet Take 81 mg by mouth daily.    . Calcium-Vitamin D 600-125 MG-UNIT TABS Take 2 tablets by mouth daily.    . Cholecalciferol (VITAMIN D PO) Take 1,000 mg by mouth daily.    Marland Kitchen. gabapentin (NEURONTIN) 300 MG capsule Take 800 mg by mouth 4 (four) times daily.     Marland Kitchen. loratadine (CLARITIN) 10 MG tablet Take 10 mg by mouth daily.    Marland Kitchen. lubiprostone (AMITIZA) 24 MCG capsule Take 1 capsule (24 mcg total) by mouth daily with breakfast. (Patient taking differently: Take 24 mcg by mouth daily with supper. ) 60 capsule 3  . Magnesium 400 MG CAPS Take 1 tablet by mouth daily.    Marland Kitchen. omeprazole (PRILOSEC) 20 MG capsule Take 1 capsule (20 mg total) by mouth daily. 90 capsule 3  . ondansetron (ZOFRAN) 4 MG tablet Take 1 tablet (4 mg total) by mouth every 8 (eight) hours as needed for nausea or vomiting. 20 tablet 0  . oxyCODONE-acetaminophen (PERCOCET) 10-325 MG tablet Take 1 tablet by mouth 4 (four) times daily as needed for pain.    . promethazine (PHENERGAN) 25  MG tablet TAKE 1/2 TABLET EVERY 6 HOURS AS NEEDED FOR NAUSEA AND VOMITING (Patient taking differently: Takes one tablet as needed) 30 tablet 0  . rOPINIRole (REQUIP) 2 MG tablet Take 4 mg by mouth at bedtime.      No current facility-administered medications for this visit.    Allergies as of 06/16/2016 - Review Complete 06/16/2016  Allergen Reaction Noted  . Nyquil multi-symptom [pseudoeph-doxylamine-dm-apap]  03/17/2016    Family History  Problem Relation Age of Onset  . Colon cancer Neg Hx     Social History   Social History  . Marital Status: Married    Spouse Name: N/A  . Number of Children: N/A  . Years of Education: N/A   Occupational  History  . retired    Social History Main Topics  . Smoking status: Current Every Day Smoker -- 0.75 packs/day    Types: Cigarettes  . Smokeless tobacco: Never Used     Comment: 3/4 pack cigarettes daily  . Alcohol Use: No  . Drug Use: No  . Sexual Activity: Not Asked   Other Topics Concern  . None   Social History Narrative    Review of Systems: 10-point ROS negative except as per HPI.   Physical Exam: BP 125/72 mmHg  Pulse 74  Temp(Src) 98.8 F (37.1 C) (Oral)  Ht  (1.575 m)  Wt 118 lb 6.4 oz (53.706 kg)  BMI 21.65 kg/m2 General:   Alert and oriented. Pleasant and cooperative. Well-nourished and well-developed.  Ears:  Normal auditory acuity. Cardiovascular:  S1, S2 present without murmurs appreciated. Extremities without clubbing or edema. Respiratory:  Clear to auscultation bilaterally. No wheezes, rales, or rhonchi. No distress.  Gastrointestinal:  +BS, soft, non-tender and non-distended. No HSM noted. No guarding or rebound. No masses appreciated.  Rectal:  Deferred  Musculoskalatal:  Symmetrical without gross deformities. Normal posture. Skin:  Intact without significant lesions or rashes. Neurologic:  Alert and oriented x4;  grossly normal neurologically. Psych:  Alert and cooperative. Normal mood and affect. Heme/Lymph/Immune: No significant cervical adenopathy. No excessive bruising noted.    06/16/2016 12:05 PM   Disclaimer: This note was dictated with voice recognition software. Similar sounding words can inadvertently be transcribed and may not be corrected upon review.

## 2016-06-16 NOTE — Assessment & Plan Note (Signed)
Likely opioid-induced constipation. Improved with Amitiza, prune juice, yogurt. Continue current medications. Return for follow-up in 3 months.

## 2016-06-16 NOTE — Patient Instructions (Signed)
1. For gas pain he can try simethicone over-the-counter (1 brand example is Gas-X). 2. If you have difficulty finding it or aren't sure which brand or option to use, discuss with the pharmacist. 3. Continue taking Prilosec that you have been for acid reflux. 4. Continue taking Amitiza, yogurt, and prunes as you have been. 5. Return for follow-up in 3 months.

## 2016-06-16 NOTE — Assessment & Plan Note (Signed)
Well-controlled on PPI. Continue current medications, return for follow-up in 3 months.

## 2016-06-16 NOTE — Assessment & Plan Note (Signed)
Abdominal pain resolved on PPI and Amitiza. Continue current medications, return for follow-up in 3 months.

## 2016-06-16 NOTE — Progress Notes (Signed)
cc'ed to pcp °

## 2016-09-16 ENCOUNTER — Ambulatory Visit: Payer: Medicare Other | Admitting: Nurse Practitioner

## 2016-10-03 ENCOUNTER — Other Ambulatory Visit: Payer: Self-pay | Admitting: Physician Assistant

## 2016-10-13 ENCOUNTER — Telehealth: Payer: Self-pay | Admitting: Nurse Practitioner

## 2016-10-13 ENCOUNTER — Ambulatory Visit: Payer: Medicare Other | Admitting: Nurse Practitioner

## 2016-10-13 NOTE — Telephone Encounter (Signed)
Noted  

## 2016-10-13 NOTE — Telephone Encounter (Signed)
Pt was a no show

## 2016-10-13 NOTE — Progress Notes (Deleted)
Referring Provider: Remus LofflerJones, Angel S, PA-C Primary Care Physician:  Remus LofflerAngel S Jones, PA-C Primary GI:  Dr. Darrick PennaFields  No chief complaint on file.   HPI:   Jerilynn BirkenheadVirginia J Santos is a 70 y.o. female who presents for 3 month follow-up. The patient was last seen in our office 04/28/2017 for GERD, constipation, abdominal pain. At that time she noted she was still having abdominal pain and postprandial nausea and vomiting. Symptoms typically worse at night after dinner. Only having a bowel movement every 2-3 days with subsequent diarrhea when she does have a bowel movement, likely overflow diarrhea. Also epigastric pain and esophageal burning/bitter taste. Recent CT scan with distended rectum likely constipation with overflow diarrhea. An abdominal x-ray was ordered to assess for stool burden and she was started on Amitiza 24 g twice a day requesting two-week progress report. She was also started on omeprazole and recommended consider HIDA scan for functional gallbladder disease if persistent symptoms.  X-ray found prominent stool in the colon. She called our office 3 weeks after her last visit asking for a prescription of Amitiza. The patient's daughter called shortly thereafter to say the patient was having diarrhea on Amitiza and she was recommended to hold Amitiza until diarrhea resolves and call us back if it is persistent for more and 2-3 days. She called back a month later asking for a prescription for Amitiza to be sent back to the pharmacy, requesting a progress report in 1-2 weeks. No progress report called.    Past Medical History:  Diagnosis Date  . Chest pain   . Chronic pain syndrome   . Dyslipidemia   . GERD (gastroesophageal reflux disease)   . Head ache   . Heart murmur   . Migraines   . Mitral valve prolapse   . Tobacco abuse     Past Surgical History:  Procedure Laterality Date  . bunyunectomy    . CARPAL TUNNEL RELEASE    . CATARACT EXTRACTION    . hysterectomy---unknown       Current Outpatient Prescriptions  Medication Sig Dispense Refill  . ALPRAZolam (XANAX) 1 MG tablet Take 1 mg by mouth 4 (four) times daily as needed for anxiety.     Marland Kitchen. amLODipine (NORVASC) 5 MG tablet Take 5 mg by mouth daily.    Marland Kitchen. aspirin 81 MG tablet Take 81 mg by mouth daily.    . Calcium-Vitamin D 600-125 MG-UNIT TABS Take 2 tablets by mouth daily.    . Cholecalciferol (VITAMIN D PO) Take 1,000 mg by mouth daily.    Marland Kitchen. gabapentin (NEURONTIN) 300 MG capsule Take 800 mg by mouth 4 (four) times daily.     Marland Kitchen. loratadine (CLARITIN) 10 MG tablet Take 10 mg by mouth daily.    Marland Kitchen. lubiprostone (AMITIZA) 24 MCG capsule Take 1 capsule (24 mcg total) by mouth daily with breakfast. (Patient taking differently: Take 24 mcg by mouth daily with supper. ) 60 capsule 3  . Magnesium 400 MG CAPS Take 1 tablet by mouth daily.    Marland Kitchen. omeprazole (PRILOSEC) 20 MG capsule Take 1 capsule (20 mg total) by mouth daily. 90 capsule 3  . ondansetron (ZOFRAN) 4 MG tablet Take 1 tablet (4 mg total) by mouth every 8 (eight) hours as needed for nausea or vomiting. 20 tablet 0  . oxyCODONE-acetaminophen (PERCOCET) 10-325 MG tablet Take 1 tablet by mouth 4 (four) times daily as needed for pain.    . promethazine (PHENERGAN) 25 MG tablet TAKE 1/2 TABLET EVERY  6 HOURS AS NEEDED FOR NAUSEA AND VOMITING (Patient taking differently: Takes one tablet as needed) 30 tablet 0  . rOPINIRole (REQUIP) 2 MG tablet Take 4 mg by mouth at bedtime.      No current facility-administered medications for this visit.     Allergies as of 10/13/2016 - Review Complete 06/16/2016  Allergen Reaction Noted  . Nyquil multi-symptom [pseudoeph-doxylamine-dm-apap]  03/17/2016    Family History  Problem Relation Age of Onset  . Colon cancer Neg Hx     Social History   Social History  . Marital status: Married    Spouse name: N/A  . Number of children: N/A  . Years of education: N/A   Occupational History  . retired    Social History Main  Topics  . Smoking status: Current Every Day Smoker    Packs/day: 0.75    Types: Cigarettes  . Smokeless tobacco: Never Used     Comment: 3/4 pack cigarettes daily  . Alcohol use No  . Drug use: No  . Sexual activity: Not on file   Other Topics Concern  . Not on file   Social History Narrative  . No narrative on file    Review of Systems: Complete ROS negative except as per HPI.   Physical Exam: There were no vitals taken for this visit. General:   Alert and oriented. Pleasant and cooperative. Well-nourished and well-developed.  Head:  Normocephalic and atraumatic. Eyes:  Without icterus, sclera clear and conjunctiva pink.  Ears:  Normal auditory acuity. Mouth:  No deformity or lesions, oral mucosa pink.  Throat/Neck:  Supple, without mass or thyromegaly. Cardiovascular:  S1, S2 present without murmurs appreciated. Normal pulses noted. Extremities without clubbing or edema. Respiratory:  Clear to auscultation bilaterally. No wheezes, rales, or rhonchi. No distress.  Gastrointestinal:  +BS, soft, non-tender and non-distended. No HSM noted. No guarding or rebound. No masses appreciated.  Rectal:  Deferred  Musculoskalatal:  Symmetrical without gross deformities. Normal posture. Skin:  Intact without significant lesions or rashes. Neurologic:  Alert and oriented x4;  grossly normal neurologically. Psych:  Alert and cooperative. Normal mood and affect. Heme/Lymph/Immune: No significant cervical adenopathy. No excessive bruising noted.    10/13/2016 10:01 AM   Disclaimer: This note was dictated with voice recognition software. Similar sounding words can inadvertently be transcribed and may not be corrected upon review.

## 2016-11-30 ENCOUNTER — Other Ambulatory Visit (HOSPITAL_COMMUNITY): Payer: Self-pay | Admitting: Endocrinology

## 2016-11-30 DIAGNOSIS — E0590 Thyrotoxicosis, unspecified without thyrotoxic crisis or storm: Secondary | ICD-10-CM

## 2016-12-07 ENCOUNTER — Ambulatory Visit (HOSPITAL_COMMUNITY): Payer: Medicare Other

## 2016-12-08 ENCOUNTER — Encounter (HOSPITAL_COMMUNITY): Payer: No Typology Code available for payment source

## 2016-12-13 ENCOUNTER — Other Ambulatory Visit: Payer: Self-pay | Admitting: Physician Assistant

## 2017-04-14 ENCOUNTER — Other Ambulatory Visit (HOSPITAL_COMMUNITY): Payer: Self-pay | Admitting: Orthopedic Surgery

## 2017-04-14 DIAGNOSIS — M25511 Pain in right shoulder: Secondary | ICD-10-CM

## 2017-04-19 ENCOUNTER — Encounter (HOSPITAL_COMMUNITY): Payer: Self-pay

## 2017-04-19 ENCOUNTER — Ambulatory Visit (HOSPITAL_COMMUNITY): Payer: Medicare Other

## 2017-04-19 ENCOUNTER — Ambulatory Visit (HOSPITAL_COMMUNITY)
Admission: RE | Admit: 2017-04-19 | Discharge: 2017-04-19 | Disposition: A | Discharge status: Home / Self Care | Payer: Medicare Other | Source: Ambulatory Visit | Attending: Orthopedic Surgery | Admitting: Orthopedic Surgery

## 2017-04-19 DIAGNOSIS — M25511 Pain in right shoulder: Secondary | ICD-10-CM

## 2017-04-19 DIAGNOSIS — M75101 Unspecified rotator cuff tear or rupture of right shoulder, not specified as traumatic: Secondary | ICD-10-CM | POA: Insufficient documentation

## 2017-04-19 DIAGNOSIS — R937 Abnormal findings on diagnostic imaging of other parts of musculoskeletal system: Secondary | ICD-10-CM | POA: Diagnosis not present

## 2017-04-19 DIAGNOSIS — M25411 Effusion, right shoulder: Secondary | ICD-10-CM | POA: Diagnosis not present

## 2017-04-19 DIAGNOSIS — M659 Synovitis and tenosynovitis, unspecified: Secondary | ICD-10-CM | POA: Diagnosis not present

## 2017-05-11 ENCOUNTER — Ambulatory Visit: Payer: Self-pay | Admitting: Physician Assistant

## 2017-05-21 ENCOUNTER — Encounter (HOSPITAL_COMMUNITY): Payer: Self-pay | Admitting: Emergency Medicine

## 2017-05-21 ENCOUNTER — Emergency Department (HOSPITAL_COMMUNITY)
Admission: EM | Admit: 2017-05-21 | Discharge: 2017-05-21 | Disposition: A | Discharge status: Home / Self Care | Payer: Medicare Other | Attending: Emergency Medicine | Admitting: Emergency Medicine

## 2017-05-21 DIAGNOSIS — M79605 Pain in left leg: Secondary | ICD-10-CM

## 2017-05-21 DIAGNOSIS — Z7982 Long term (current) use of aspirin: Secondary | ICD-10-CM | POA: Insufficient documentation

## 2017-05-21 DIAGNOSIS — I8002 Phlebitis and thrombophlebitis of superficial vessels of left lower extremity: Secondary | ICD-10-CM | POA: Insufficient documentation

## 2017-05-21 DIAGNOSIS — F17210 Nicotine dependence, cigarettes, uncomplicated: Secondary | ICD-10-CM | POA: Diagnosis not present

## 2017-05-21 DIAGNOSIS — Z79899 Other long term (current) drug therapy: Secondary | ICD-10-CM | POA: Insufficient documentation

## 2017-05-21 MED ORDER — CEPHALEXIN 500 MG PO CAPS: 500.0000 mg | ORAL_CAPSULE | Freq: Four times a day (QID) | ORAL | 0 refills | Status: DC

## 2017-05-21 MED ORDER — CEPHALEXIN 500 MG PO CAPS
500.0000 mg | ORAL_CAPSULE | Freq: Once | ORAL | Status: AC
  Administered 2017-05-21: 500 mg via ORAL
  Filled 2017-05-21: qty 1

## 2017-05-21 NOTE — ED Triage Notes (Signed)
Patient complains of left knee pain. States redness and heat medial side of left knee that radiates up to thigh. Patient says that the area is now raised and she is afraid she has a blood clot.

## 2017-05-21 NOTE — ED Triage Notes (Signed)
Noticed 3 days ago pain to  L Knee, with a red circle and now with knot and pain  Dr Charm BargesButler is PCP

## 2017-05-21 NOTE — Discharge Instructions (Signed)
Your examination today is mostly consistent with superficial thrombophlebitis.  The treatment for this includes elevation of your leg above your heart, frequent heat applications, antibiotic medication, and Tylenol for pain.  Start the antibiotic prescription today.  We have ordered an ultrasound to be done tomorrow to evaluate you for a deep venous thrombosis.  It is unlikely that you have that at this time.  Get this test you will need to call radiology central scheduling, at phone number [phone].  Make sure that you call first thing, Monday morning to schedule the appointment for later that day.  If you experience further problems or difficulties, return to the emergency department.

## 2017-05-21 NOTE — ED Provider Notes (Signed)
[facility]-EMERGENCY DEPT Provider Note   CSN: [id number] Arrival date & time: 05/21/17  1407     History   Chief Complaint Chief Complaint  Patient presents with  . Knee Pain    HPI Amy Santos is a 71 y.o. female.  She presents for evaluation of a problem with her left upper leg, described as "a knot" which appeared yesterday.  It was preceded by a small reddened area at the left medial knee, 1 day prior to that.  No preceding trauma.  No history of DVT.  She denies fever, chills, shortness of breath, chest pain, weakness or dizziness.  She recently had right shoulder surgery.  She is ambulating normally, and without limitation.  There are no other known modifying factors.  HPI  Past Medical History:  Diagnosis Date  . Chest pain   . Chronic pain syndrome   . Dyslipidemia   . GERD (gastroesophageal reflux disease)   . Head ache   . Heart murmur   . Migraines   . Mitral valve prolapse   . Tobacco abuse     Patient Active Problem List   Diagnosis Date Noted  . Flatulence 06/16/2016  . Constipation 04/28/2016  . Abdominal pain, chronic, right upper quadrant 04/28/2016  . HYPERTENSION 11/22/2010  . PURE HYPERCHOLESTEROLEMIA 11/01/2010  . NONDEPENDENT TOBACCO USE DISORDER 11/01/2010  . CHRONIC PAIN SYNDROME 11/01/2010  . ESOPHAGEAL REFLUX 11/01/2010  . CHEST PAIN UNSPECIFIED 11/01/2010    Past Surgical History:  Procedure Laterality Date  . bunyunectomy    . CARPAL TUNNEL RELEASE    . CATARACT EXTRACTION    . hysterectomy---unknown      OB History    No data available       Home Medications    Prior to Admission medications   Medication Sig Start Date End Date Taking? Authorizing Provider  ALPRAZolam Prudy Feeler) 1 MG tablet Take 1 mg by mouth 4 (four) times daily as needed for anxiety.     [provider]  amLODipine (NORVASC) 5 MG tablet Take 5 mg by mouth daily.    [provider]  aspirin 81 MG tablet Take 81 mg by mouth daily.     [provider]  Calcium-Vitamin D 600-125 MG-UNIT TABS Take 2 tablets by mouth daily.    [provider]  cephALEXin (KEFLEX) 500 MG capsule Take 1 capsule (500 mg total) by mouth 4 (four) times daily. 05/21/17   Mancel Bale, MD  Cholecalciferol (VITAMIN D PO) Take 1,000 mg by mouth daily.    [provider]  gabapentin (NEURONTIN) 300 MG capsule Take 800 mg by mouth 4 (four) times daily.  02/03/16   [provider]  loratadine (CLARITIN) 10 MG tablet Take 10 mg by mouth daily.    [provider]  lubiprostone (AMITIZA) 24 MCG capsule Take 1 capsule (24 mcg total) by mouth daily with breakfast. Patient taking differently: Take 24 mcg by mouth daily with supper.  05/26/16   Anice Paganini, NP  Magnesium 400 MG CAPS Take 1 tablet by mouth daily.    [provider]  omeprazole (PRILOSEC) 20 MG capsule Take 1 capsule (20 mg total) by mouth daily. 04/28/16   Anice Paganini, NP  ondansetron (ZOFRAN) 4 MG tablet Take 1 tablet (4 mg total) by mouth every 8 (eight) hours as needed for nausea or vomiting. 05/20/16   Anice Paganini, NP  oxyCODONE-acetaminophen (PERCOCET) 10-325 MG tablet Take 1 tablet by mouth 4 (four) times daily  as needed for pain.    [provider]  promethazine (PHENERGAN) 25 MG tablet TAKE 1/2 TABLET EVERY 6 HOURS AS NEEDED FOR NAUSEA AND VOMITING Patient taking differently: Takes one tablet as needed 05/24/16   Tiffany KocherLewis, Leslie S, PA-C  rOPINIRole (REQUIP) 2 MG tablet Take 4 mg by mouth at bedtime.  05/29/15   [provider]    Family History Family History  Problem Relation Age of Onset  . Colon cancer Neg Hx     Social History Social History  Substance Use Topics  . Smoking status: Current Every Day Smoker    Packs/day: 0.75    Types: Cigarettes  . Smokeless tobacco: Never Used     Comment: 3/4 pack cigarettes daily  . Alcohol use No     Allergies   Nyquil multi-symptom  [pseudoeph-doxylamine-dm-apap]   Review of Systems Review of Systems  All other systems reviewed and are negative.    Physical Exam Updated Vital Signs BP 123/67   Pulse 69   Temp 98.4 F (36.9 C) (Oral)   Resp 15   Ht 5\' 2"  (1.575 m)   Wt 56.2 kg (124 lb)   SpO2 96%   BMI 22.68 kg/m   Physical Exam  Constitutional: She is oriented to person, place, and time. She appears well-developed and well-nourished. No distress.  HENT:  Head: Normocephalic and atraumatic.  Eyes: Conjunctivae and EOM are normal. Pupils are equal, round, and reactive to light.  Neck: Normal range of motion and phonation normal. Neck supple.  Cardiovascular: Normal rate.   Pulmonary/Chest: Effort normal.  Musculoskeletal: Normal range of motion. She exhibits no edema or deformity.  Neurological: She is alert and oriented to person, place, and time. She exhibits normal muscle tone.  Skin: Skin is warm and dry.  Left leg-medial aspect above knee with diffuse red area, and palpable nodule, just above the knee, somewhat irregular, about 2 x 3 cm.  This area is not fluctuant.  The reddened area is in an area of venous drainage.  Psychiatric: She has a normal mood and affect. Her behavior is normal. Judgment and thought content normal.  Nursing note and vitals reviewed.    ED Treatments / Results  Labs (all labs ordered are listed, but only abnormal results are displayed) Labs Reviewed - No data to display  EKG  EKG Interpretation None       Radiology No results found.  Procedures Procedures (including critical care time)  Medications Ordered in ED Medications  cephALEXin (KEFLEX) capsule 500 mg (500 mg Oral Given 05/21/17 1616)     Initial Impression / Assessment and Plan / ED Course  I have reviewed the triage vital signs and the nursing notes.  Pertinent labs & imaging results that were available during my care of the patient were reviewed by me and considered in my medical decision  making (see chart for details).      Patient Vitals for the past 24 hrs:  BP Temp Temp src Pulse Resp SpO2 Height Weight  05/21/17 1600 123/67 - - 69 15 96 % - -  05/21/17 1427 (!) 143/76 98.4 F (36.9 C) Oral 80 18 98 % 5\' 2"  (1.575 m) 56.2 kg (124 lb)    At discharge- reevaluation with update and discussion. After initial assessment and treatment, an updated evaluation reveals she remains comfortable and has no further complaints.  Findings discussed and questions answered. Amy Santos L    Final Clinical Impressions(s) / ED Diagnoses   Final diagnoses:  Thrombophlebitis of superficial veins of left lower extremity   Clinically patient has superficial thrombophlebitis.  No distal swelling, doubt DVT.  Doubt cellulitis or serious bacterial infection.  Nursing Notes Reviewed/ Care Coordinated Applicable Imaging Reviewed Interpretation of Laboratory Data incorporated into ED treatment  The patient appears reasonably screened and/or stabilized for discharge and I doubt any other medical condition or other [place] requiring further screening, evaluation, or treatment in the ED at this time prior to discharge.  Plan: Home Medications-continue current medications; Home Treatments-elevation leg, and heat to affected area; return here if the recommended treatment, does not improve the symptoms; Recommended follow up-return in morning for Doppler study to evaluate for DVT.   New Prescriptions Discharge Medication List as of 05/21/2017  4:03 PM    START taking these medications   Details  cephALEXin (KEFLEX) 500 MG capsule Take 1 capsule (500 mg total) by mouth 4 (four) times daily., Starting Sun 05/21/2017, Print         Mancel Bale, MD 05/21/17 2207

## 2017-05-22 ENCOUNTER — Ambulatory Visit (HOSPITAL_COMMUNITY)
Admission: RE | Admit: 2017-05-22 | Discharge: 2017-05-22 | Disposition: A | Discharge status: Home / Self Care | Payer: Medicare Other | Source: Ambulatory Visit | Attending: Emergency Medicine | Admitting: Emergency Medicine

## 2017-05-22 ENCOUNTER — Other Ambulatory Visit: Payer: Self-pay | Admitting: Physician Assistant

## 2017-05-22 DIAGNOSIS — M79605 Pain in left leg: Secondary | ICD-10-CM | POA: Diagnosis present

## 2017-05-22 DIAGNOSIS — G8929 Other chronic pain: Secondary | ICD-10-CM

## 2017-05-22 DIAGNOSIS — K219 Gastro-esophageal reflux disease without esophagitis: Secondary | ICD-10-CM

## 2017-05-22 DIAGNOSIS — R1011 Right upper quadrant pain: Secondary | ICD-10-CM

## 2017-05-22 DIAGNOSIS — K5900 Constipation, unspecified: Secondary | ICD-10-CM

## 2017-05-22 NOTE — ED Provider Notes (Signed)
Discussed results of venous ultrasound of the left lower extremity with the patient and her husband. Recommended heat, aspirin, elevation.   Donnetta Hutchingook, Mikal Blasdell, MD 05/22/17 1007

## 2017-10-10 ENCOUNTER — Other Ambulatory Visit: Payer: Self-pay | Admitting: Orthopaedic Surgery

## 2017-10-10 DIAGNOSIS — M25512 Pain in left shoulder: Secondary | ICD-10-CM

## 2017-10-12 ENCOUNTER — Other Ambulatory Visit: Payer: Medicare Other

## 2017-10-17 ENCOUNTER — Encounter (HOSPITAL_COMMUNITY)
Admission: RE | Admit: 2017-10-17 | Discharge: 2017-10-17 | Disposition: A | Discharge status: Home / Self Care | Payer: Medicare Other | Source: Ambulatory Visit | Attending: Orthopaedic Surgery | Admitting: Orthopaedic Surgery

## 2017-10-17 ENCOUNTER — Other Ambulatory Visit: Payer: Self-pay

## 2017-10-17 ENCOUNTER — Encounter (HOSPITAL_COMMUNITY): Payer: Self-pay

## 2017-10-17 DIAGNOSIS — Z01812 Encounter for preprocedural laboratory examination: Secondary | ICD-10-CM | POA: Insufficient documentation

## 2017-10-17 HISTORY — DX: Myoneural disorder, unspecified: G70.9

## 2017-10-17 HISTORY — DX: Essential (primary) hypertension: I10

## 2017-10-17 HISTORY — DX: Depression, unspecified: F32.A

## 2017-10-17 HISTORY — DX: Major depressive disorder, single episode, unspecified: F32.9

## 2017-10-17 HISTORY — DX: Anxiety disorder, unspecified: F41.9

## 2017-10-17 HISTORY — DX: Unspecified osteoarthritis, unspecified site: M19.90

## 2017-10-17 LAB — BASIC METABOLIC PANEL
Anion gap: 7 (ref 5–15)
BUN: 13 mg/dL (ref 6–20)
CALCIUM: 9.5 mg/dL (ref 8.9–10.3)
CO2: 25 mmol/L (ref 22–32)
CREATININE: 0.82 mg/dL (ref 0.44–1.00)
Chloride: 103 mmol/L (ref 101–111)
GFR calc Af Amer: >60 mL/min (ref >60)
Glucose, Bld: 84 mg/dL (ref 65–99)
Potassium: 3.7 mmol/L (ref 3.5–5.1)
SODIUM: 135 mmol/L (ref 135–145)

## 2017-10-17 LAB — CBC
HCT: 41.5 % (ref 36.0–46.0)
Hemoglobin: 13.7 g/dL (ref 12.0–15.0)
MCH: 31.7 pg (ref 26.0–34.0)
MCHC: 33 g/dL (ref 30.0–36.0)
MCV: 96.1 fL (ref 78.0–100.0)
PLATELETS: 298 10*3/uL (ref 150–400)
RBC: 4.32 MIL/uL (ref 3.87–5.11)
RDW: 13.6 % (ref 11.5–15.5)
WBC: 9.1 10*3/uL (ref 4.0–10.5)

## 2017-10-17 LAB — SURGICAL PCR SCREEN
MRSA, PCR: NEGATIVE
STAPHYLOCOCCUS AUREUS: POSITIVE — AB

## 2017-10-17 NOTE — H&P (Signed)
PREOPERATIVE H&P  Chief Complaint: LEFT SHOULDER DJD  HPI: Amy BirkenheadVirginia J Santos is a 71 y.o. female who presents for preoperative history and physical with a diagnosis of LEFT SHOULDER DJD. Symptoms are rated as moderate to severe, and have been worsening.  This is significantly impairing activities of daily living.  She has elected for surgical management.   Past Medical History:  Diagnosis Date  . Anxiety   . Arthritis    tingling down both legs, neuropathy    . Chest pain   . Chronic pain syndrome   . Depression   . Dyslipidemia   . GERD (gastroesophageal reflux disease)   . Head ache   . Heart murmur    MVP  . Hypertension   . Migraines   . Mitral valve prolapse   . Neuromuscular disorder (HCC)    neuropathy feet & legs   . Tobacco abuse    Past Surgical History:  Procedure Laterality Date  . ABDOMINAL HYSTERECTOMY    . bunyunectomy    . CARPAL TUNNEL RELEASE Bilateral   . CATARACT EXTRACTION    . EYE SURGERY Bilateral   . hysterectomy---unknown    . SHOULDER ARTHROSCOPY Right    Social History   Socioeconomic History  . Marital status: Married    Spouse name: Not on file  . Number of children: Not on file  . Years of education: Not on file  . Highest education level: Not on file  Social Needs  . Financial resource strain: Not on file  . Food insecurity - worry: Not on file  . Food insecurity - inability: Not on file  . Transportation needs - medical: Not on file  . Transportation needs - non-medical: Not on file  Occupational History  . Occupation: retired  Tobacco Use  . Smoking status: Current Every Day Smoker    Packs/day: 0.50    Types: Cigarettes  . Smokeless tobacco: Never Used  Substance and Sexual Activity  . Alcohol use: No    Alcohol/week: 0.0 oz  . Drug use: No  . Sexual activity: Not on file  Other Topics Concern  . Not on file  Social History Narrative  . Not on file   Family History  Problem Relation Age of Onset  . Colon cancer  Neg Hx    Allergies  Allergen Reactions  . Nyquil Multi-Symptom [Pseudoeph-Doxylamine-Dm-Apap]     "feels like I'm floating"   Prior to Admission medications   Medication Sig Start Date End Date Taking? Authorizing Provider  acetaminophen (TYLENOL) 500 MG tablet Take 500 mg daily as needed by mouth for moderate pain.   Yes [provider]  albuterol (PROVENTIL HFA;VENTOLIN HFA) 108 (90 Base) MCG/ACT inhaler Inhale 2 puffs every 6 (six) hours as needed into the lungs for wheezing or shortness of breath.   Yes [provider]  ALPRAZolam Prudy Feeler(XANAX) 1 MG tablet Take 1 mg 3 (three) times daily as needed by mouth for anxiety.    Yes [provider]  amLODipine (NORVASC) 5 MG tablet Take 5 mg daily before breakfast by mouth.    Yes [provider]  aspirin 81 MG tablet Take 81 mg by mouth daily.   Yes [provider]  Calcium-Vitamin D 600-125 MG-UNIT TABS Take 2 tablets by mouth daily.   Yes [provider]  cholecalciferol (VITAMIN D) 1000 units tablet Take 1,000 Units daily by mouth.   Yes [provider]  citalopram (CELEXA) 20 MG tablet Take 20 mg daily by  mouth.    Yes [provider]  gabapentin (NEURONTIN) 800 MG tablet Take 400 mg 3 (three) times daily by mouth.   Yes [provider]  loratadine (CLARITIN) 10 MG tablet Take 10 mg daily as needed by mouth.    Yes [provider]  lubiprostone (AMITIZA) 8 MCG capsule Take 8 mcg daily as needed by mouth.    Yes [provider]  Magnesium 400 MG CAPS Take 400 mg daily by mouth.    Yes [provider]  meclizine (ANTIVERT) 25 MG tablet Take 25 mg 3 (three) times daily as needed by mouth for dizziness.   Yes [provider]  mirtazapine (REMERON) 15 MG tablet Take 15 mg at bedtime as needed by mouth (sleep).   Yes [provider]  Multiple Vitamin (MULTIVITAMIN WITH MINERALS) TABS tablet Take 1 tablet daily by mouth.   Yes  [provider]  omeprazole (PRILOSEC) 20 MG capsule Take 1 capsule (20 mg total) by mouth daily. 04/28/16  Yes Anice PaganiniGill, Eric A, NP  oxyCODONE-acetaminophen (PERCOCET) 10-325 MG tablet Take 1 tablet by mouth 4 (four) times daily as needed for pain.   Yes [provider]  promethazine (PHENERGAN) 25 MG tablet TAKE 1/2 TABLET EVERY 6 HOURS AS NEEDED FOR NAUSEA AND VOMITING 05/24/16  Yes Tiffany KocherLewis, Leslie S, PA-C  rOPINIRole (REQUIP) 2 MG tablet Take 4 mg by mouth at bedtime.  05/29/15  Yes [provider]  sodium chloride (OCEAN) 0.65 % SOLN nasal spray Place 1 spray as needed into both nostrils for congestion.   Yes [provider]  ondansetron (ZOFRAN) 4 MG tablet Take 1 tablet (4 mg total) by mouth every 8 (eight) hours as needed for nausea or vomiting. Patient not taking: Reported on 10/16/2017 05/20/16   Anice PaganiniGill, Eric A, NP     Positive ROS: All other systems have been reviewed and were otherwise negative with the exception of those mentioned in the HPI and as above.  Physical Exam: General: Alert, no acute distress Cardiovascular: No pedal edema Respiratory: No cyanosis, no use of accessory musculature GI: No organomegaly, abdomen is soft and non-tender Skin: No lesions in the area of chief complaint Neurologic: Sensation intact distally Psychiatric: Patient is competent for consent with normal mood and affect Lymphatic: No axillary or cervical lymphadenopathy  MUSCULOSKELETAL: LUE: painful arc of motion, cuff strength intact, wwp hand.    Assessment: LEFT SHOULDER DJD  Plan: Plan for Procedure(s): TOTAL SHOULDER ARTHROPLASTY  The risks benefits and alternatives were discussed with the patient including but not limited to the risks of nonoperative treatment, versus surgical intervention including infection, bleeding, nerve injury,  blood clots, cardiopulmonary complications, morbidity, mortality, among others, and they were willing to proceed.   Bjorn Pippinax T  Varkey, MD  10/17/2017 1:53 PM

## 2017-10-17 NOTE — Progress Notes (Signed)
Mupirocin Ointment Rx called into [facility] in [place] for positive PCR for Staph. Spoke with pt's daughter informing her of results and need to pick up Rx. She voiced understanding.

## 2017-10-17 NOTE — Progress Notes (Signed)
Pt. Denies chest pain & /or concerns. Pt. Followed by Dr. Georgiana Shore. [place]. Pt. Reports that [facility] several yrs. Ago for chest pain, referred to [facility] cardiac & had a nuclear stress test- told it was wnl, no need to be followed any longer for cardiac.

## 2017-10-17 NOTE — Progress Notes (Signed)
[facility] Austin MilesVarkey's office, asked for preop orders to be put in.

## 2017-10-17 NOTE — Progress Notes (Signed)
Call to MD office for orders, line busy

## 2017-10-17 NOTE — Pre-Procedure Instructions (Signed)
Amy Santos  10/17/2017      [facility] [place] - [address] [place] [id number] Phone: [phone] Fax: [phone]    Your procedure is scheduled on 10/25/2017  [facility].M.  Call this number if you have problems the morning of surgery:  [phone]   Remember:  Do not eat food or drink liquids after midnight. On Tuesday night    Take these medicines the morning of surgery with A SIP OF WATER :              GABAPENTIN, OMEPRAZOLE, OXYCODONE, AMLODIPINE,  & use albuterol inhaler as needed    Do not wear jewelry, make-up or nail polish.   Do not wear lotions, powders, or perfumes, or deoderant.   Do not shave 48 hours prior to surgery.     Do not bring valuables to the hospital.   [facility] is not responsible for any belongings or valuables.  Contacts, dentures or bridgework may not be worn into surgery.  Leave your suitcase in the car.  After surgery it may be brought to your room.  For patients admitted to the hospital, discharge time will be determined by your treatment team.  Patients discharged the day of surgery will not be allowed to drive home.   Name and phone number of your driver:   Family  Special instructions:  Special Instructions: [place] - Preparing for Surgery  Before surgery, you can play an important role.  Because skin is not sterile, your skin needs to be as free of germs as possible.  You can reduce the number of germs on you skin by washing with CHG (chlorahexidine gluconate) soap before surgery.  CHG is an antiseptic cleaner which kills germs and bonds with the skin to continue killing germs even after washing.  Please DO NOT use if you have an allergy to CHG or antibacterial soaps.  If your skin becomes reddened/irritated stop using the CHG and inform your nurse when you arrive at Short Stay.  Do not shave (including legs and underarms) for at  least 48 hours prior to the first CHG shower.  You may shave your face.  Please follow these instructions carefully:   1.  Shower with CHG Soap the night before surgery and the  morning of Surgery.  2.  If you choose to wash your hair, wash your hair first as usual with your  normal shampoo.  3.  After you shampoo, rinse your hair and body thoroughly to remove the  Shampoo.  4.  Use CHG as you would any other liquid soap.  You can apply chg directly to the skin and wash gently with scrungie or a clean washcloth.  5.  Apply the CHG Soap to your body ONLY FROM THE NECK DOWN.    Do not use on open wounds or open sores.  Avoid contact with your eyes, ears, mouth and genitals (private parts).  Wash genitals (private parts)   with your normal soap.  6.  Wash thoroughly, paying special attention to the area where your surgery will be performed.  7.  Thoroughly rinse your body with warm water from the neck down.  8.  DO NOT shower/wash with your normal soap after using and rinsing off   the CHG Soap.  9.  Pat yourself dry with a clean towel.  10.  Wear clean pajamas.            11.  Place clean sheets on your bed the night of your first shower and do not sleep with pets.  Day of Surgery  Do not apply any lotions/deodorants the morning of surgery.  Please wear clean clothes to the hospital/surgery center.  Please read over the following fact sheets that you were given. Pain Booklet, Coughing and Deep Breathing, MRSA Information and Surgical Site Infection Prevention

## 2017-10-24 ENCOUNTER — Encounter (HOSPITAL_COMMUNITY): Payer: Self-pay | Admitting: Certified Registered Nurse Anesthetist

## 2017-10-24 MED ORDER — TRANEXAMIC ACID 1000 MG/10ML IV SOLN
1000.0000 mg | INTRAVENOUS | Status: DC
  Filled 2017-10-24: qty 10

## 2017-10-24 MED ORDER — SODIUM CHLORIDE 0.9 % IV SOLN
1000.0000 mg | INTRAVENOUS | Status: DC
  Filled 2017-10-24: qty 10

## 2017-10-25 ENCOUNTER — Inpatient Hospital Stay (HOSPITAL_COMMUNITY): Admission: RE | Admit: 2017-10-25 | Payer: Medicare Other | Source: Ambulatory Visit | Admitting: Orthopaedic Surgery

## 2017-10-25 ENCOUNTER — Encounter (HOSPITAL_COMMUNITY): Admission: RE | Payer: Self-pay | Source: Ambulatory Visit

## 2017-10-25 SURGERY — ARTHROPLASTY, SHOULDER, TOTAL
Anesthesia: Choice | Laterality: Left

## 2018-03-22 IMAGING — US US EXTREM LOW VENOUS*L*
1 series · 13 of 24 positions shown · non-contrast
Comparison: CT abdomen [DATE].

CLINICAL DATA: Left lower extremity pain.



[Series 1: us extrem low venous*left* · 0.05mm/px · 13 of 105 slices shown]
[im 1/105]
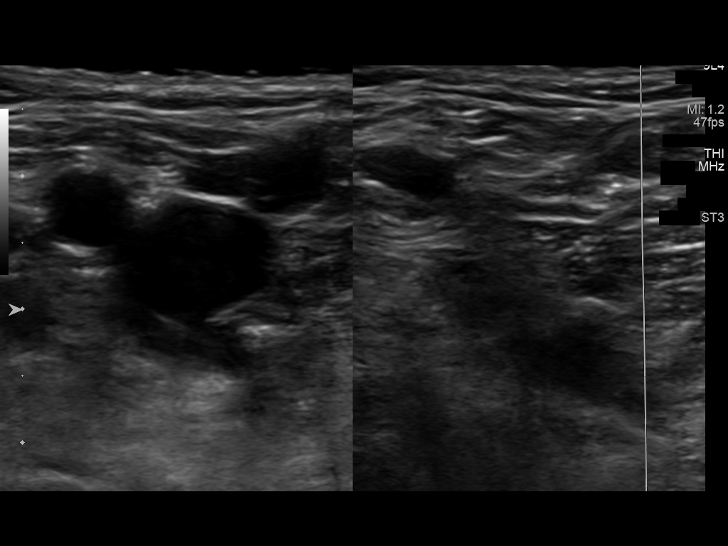
[im 10/105]
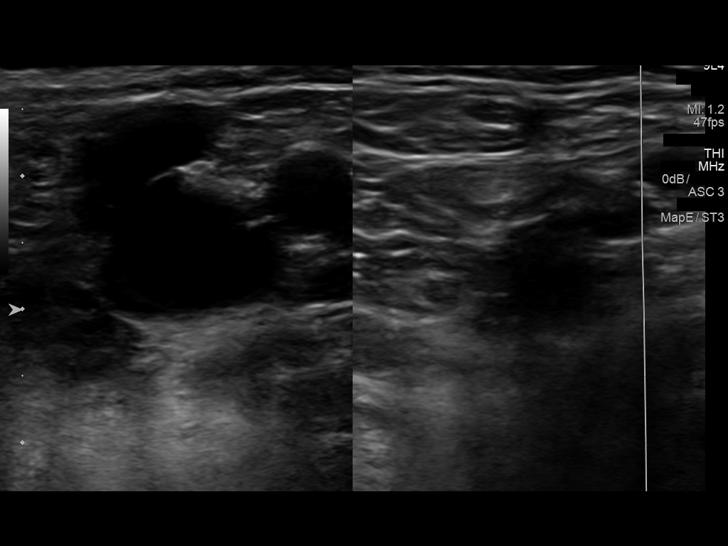
[im 19/105]
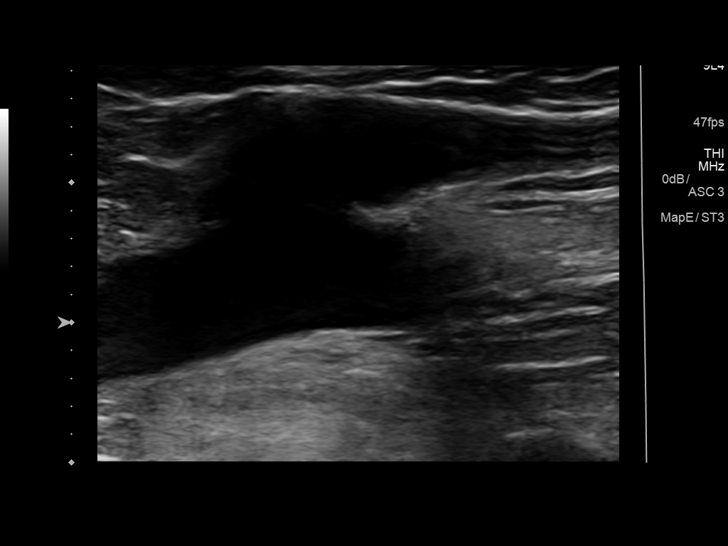
[im 28/105]
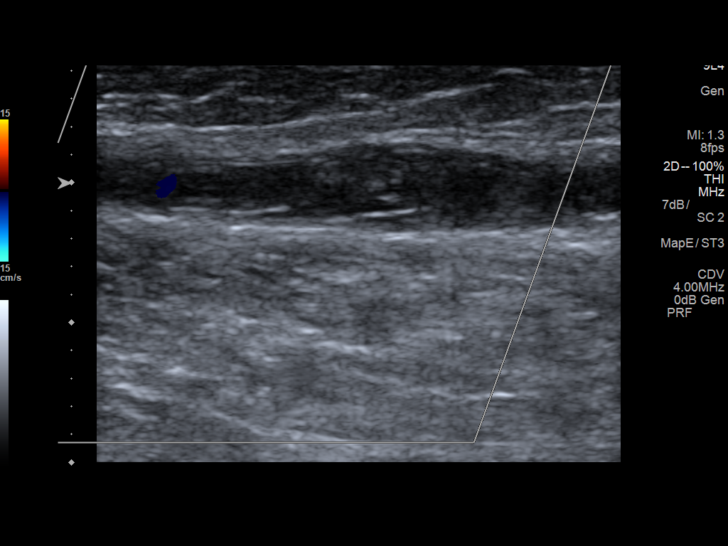
[im 37/105]
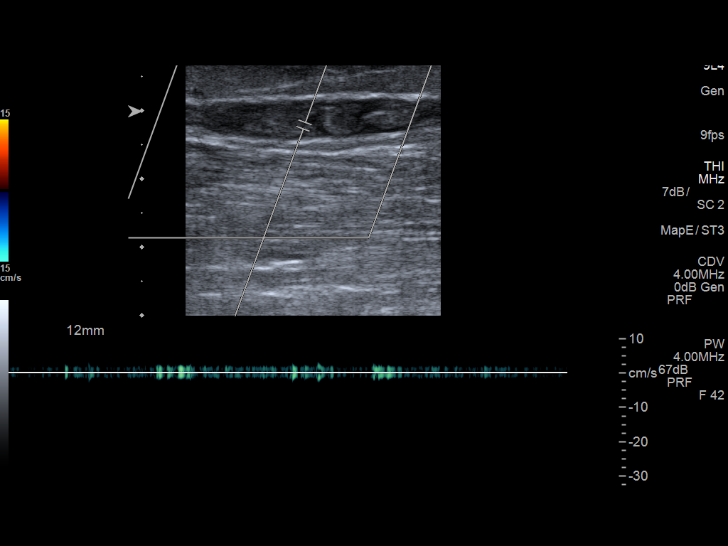
[im 46/105]
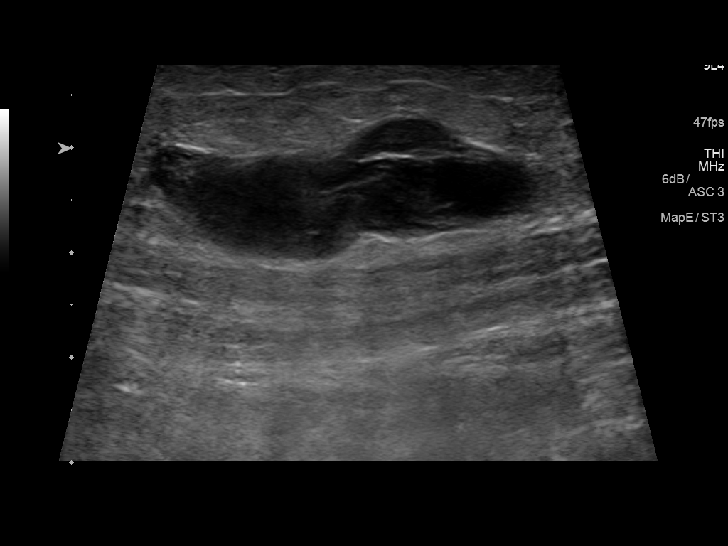
[im 55/105]
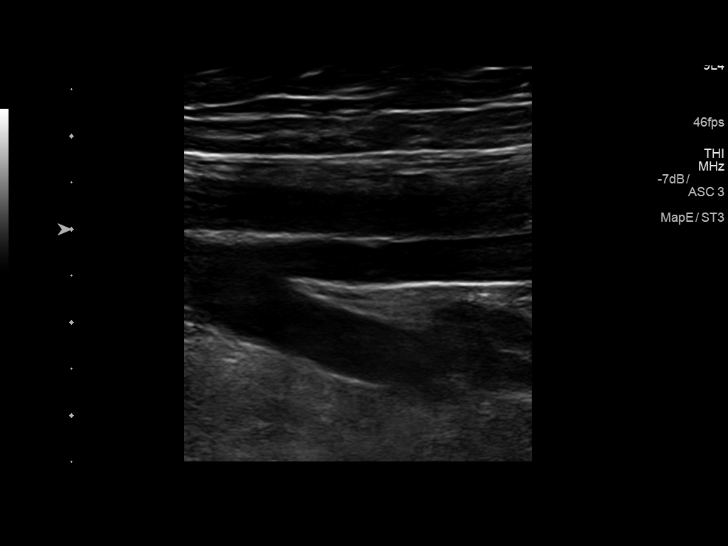
[im 59/105]
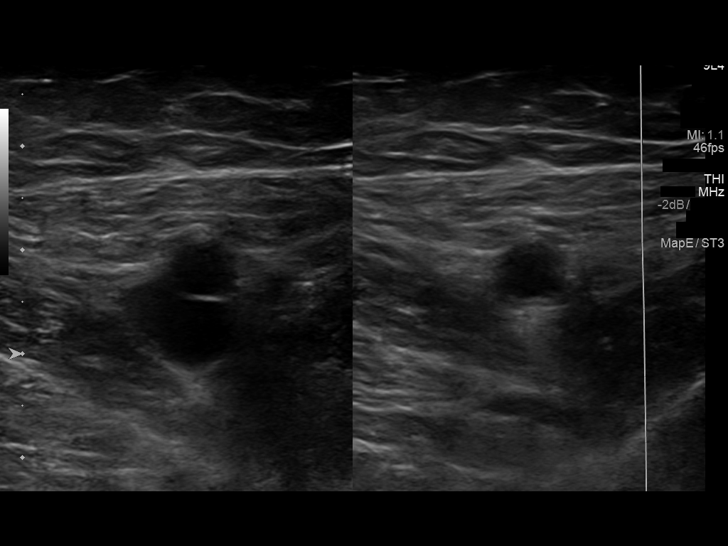
[im 68/105]
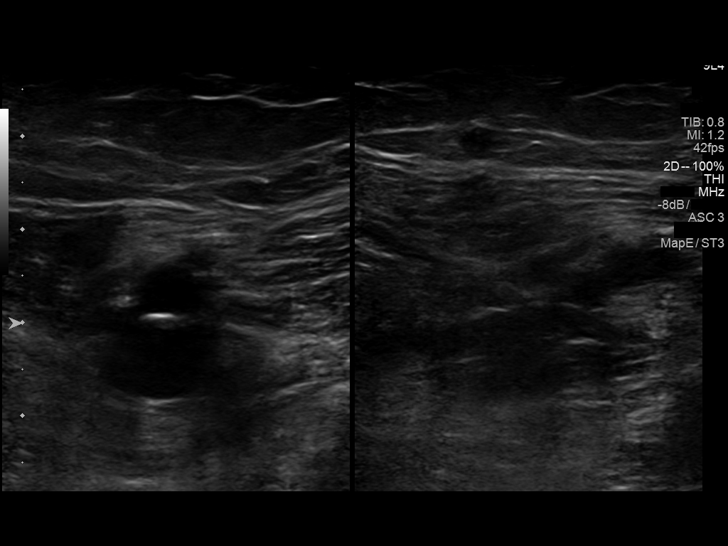
[im 77/105]
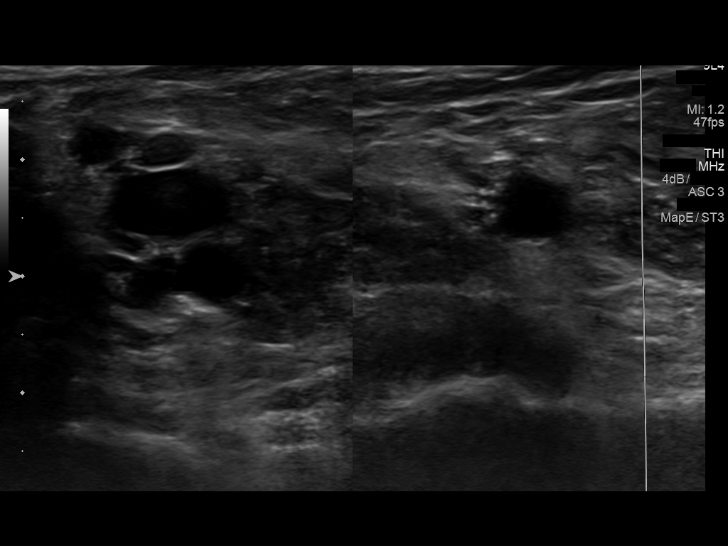
[im 86/105]
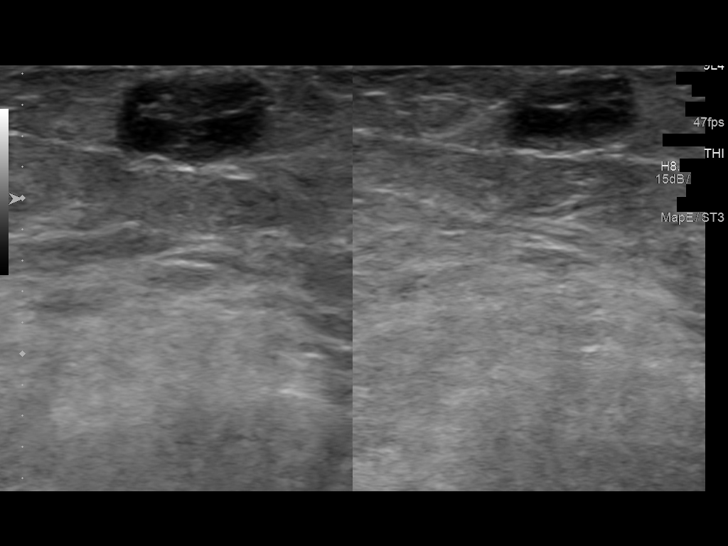
[im 95/105]
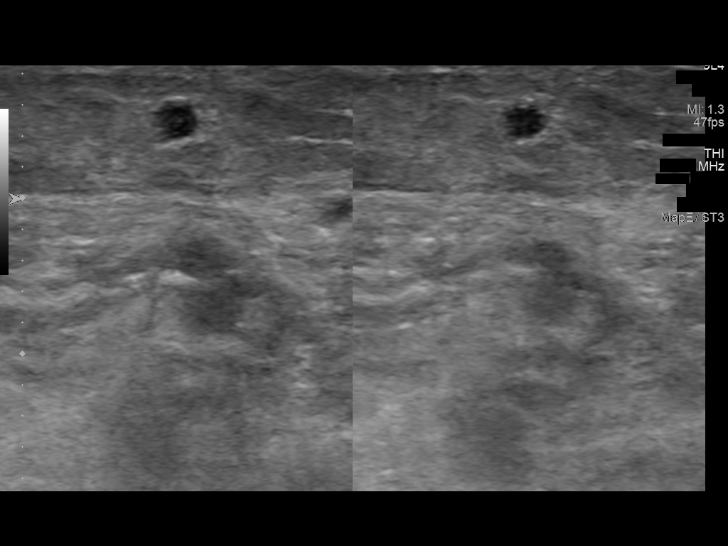
[im 105/105]
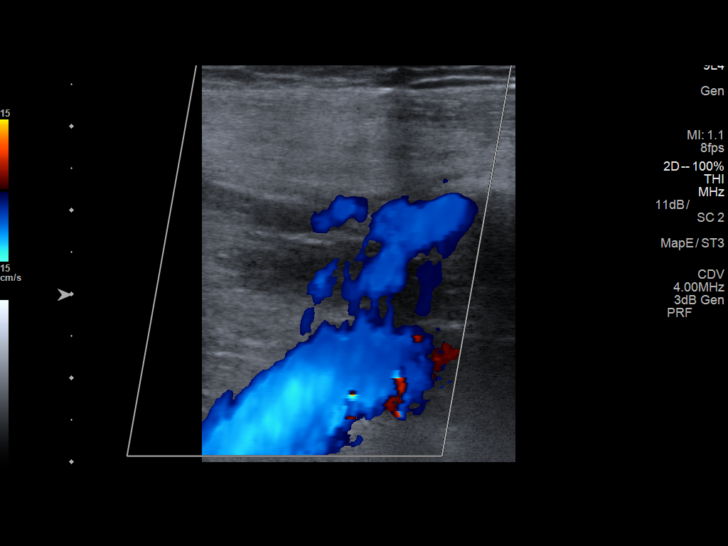

[13 of 24 positions shown; findings below may reference images not displayed]

FINDINGS: Contralateral Common Femoral Vein: Respiratory phasicity is normal
and symmetric with the symptomatic side. No evidence of thrombus.
Normal compressibility.

Common Femoral Vein: No evidence of thrombus. Normal
compressibility, respiratory phasicity and response to augmentation.

Saphenofemoral Junction: No evidence of thrombus. Normal
compressibility and flow on color Doppler imaging.

Profunda Femoral Vein: No evidence of thrombus. Normal
compressibility and flow on color Doppler imaging.

Femoral Vein: No evidence of thrombus. Normal compressibility,
respiratory phasicity and response to augmentation.

Popliteal Vein: No evidence of thrombus. Normal compressibility,
respiratory phasicity and response to augmentation.

Calf Veins: No evidence of thrombus. Normal compressibility and flow
on color Doppler imaging.

Superficial Great Saphenous Vein: Thrombus noted throughout the
greater saphenous vein on the left.

Other Findings:  None.
IMPRESSION: No evidence of DVT within the left lower extremity. Thrombus is
however noted throughout the left greater saphenous vein consistent
with superficial venous thrombosis .

## 2018-06-22 ENCOUNTER — Ambulatory Visit: Payer: Medicare Other | Admitting: Urology

## 2021-04-30 ENCOUNTER — Emergency Department (HOSPITAL_COMMUNITY)
Admission: EM | Admit: 2021-04-30 | Discharge: 2021-04-30 | Disposition: A | Discharge status: Home / Self Care | Payer: Medicare Other | Attending: Emergency Medicine | Admitting: Emergency Medicine

## 2021-04-30 ENCOUNTER — Other Ambulatory Visit: Payer: Self-pay

## 2021-04-30 ENCOUNTER — Encounter (HOSPITAL_COMMUNITY): Payer: Self-pay | Admitting: Emergency Medicine

## 2021-04-30 ENCOUNTER — Emergency Department (HOSPITAL_COMMUNITY): Payer: Medicare Other

## 2021-04-30 DIAGNOSIS — F17210 Nicotine dependence, cigarettes, uncomplicated: Secondary | ICD-10-CM | POA: Diagnosis not present

## 2021-04-30 DIAGNOSIS — I10 Essential (primary) hypertension: Secondary | ICD-10-CM | POA: Diagnosis not present

## 2021-04-30 DIAGNOSIS — R531 Weakness: Secondary | ICD-10-CM | POA: Insufficient documentation

## 2021-04-30 DIAGNOSIS — E876 Hypokalemia: Secondary | ICD-10-CM | POA: Diagnosis not present

## 2021-04-30 DIAGNOSIS — Z79899 Other long term (current) drug therapy: Secondary | ICD-10-CM | POA: Diagnosis not present

## 2021-04-30 DIAGNOSIS — Z7982 Long term (current) use of aspirin: Secondary | ICD-10-CM | POA: Insufficient documentation

## 2021-04-30 DIAGNOSIS — I959 Hypotension, unspecified: Secondary | ICD-10-CM | POA: Diagnosis not present

## 2021-04-30 LAB — CBC WITH DIFFERENTIAL/PLATELET
Abs Immature Granulocytes: 0.05 10*3/uL (ref 0.00–0.07)
Basophils Absolute: 0 10*3/uL (ref 0.0–0.1)
Basophils Relative: 0 %
Eosinophils Absolute: 0.1 10*3/uL (ref 0.0–0.5)
Eosinophils Relative: 1 %
HCT: 34.5 % — ABNORMAL LOW (ref 36.0–46.0)
Hemoglobin: 10.6 g/dL — ABNORMAL LOW (ref 12.0–15.0)
Immature Granulocytes: 0 %
Lymphocytes Relative: 10 %
Lymphs Abs: 1.1 10*3/uL (ref 0.7–4.0)
MCH: 28.3 pg (ref 26.0–34.0)
MCHC: 30.7 g/dL (ref 30.0–36.0)
MCV: 92.2 fL (ref 80.0–100.0)
Monocytes Absolute: 0.4 10*3/uL (ref 0.1–1.0)
Monocytes Relative: 4 %
Neutro Abs: 9.9 10*3/uL — ABNORMAL HIGH (ref 1.7–7.7)
Neutrophils Relative %: 85 %
Platelets: 461 10*3/uL — ABNORMAL HIGH (ref 150–400)
RBC: 3.74 MIL/uL — ABNORMAL LOW (ref 3.87–5.11)
RDW: 15 % (ref 11.5–15.5)
WBC: 11.7 10*3/uL — ABNORMAL HIGH (ref 4.0–10.5)
nRBC: 0 % (ref 0.0–0.2)

## 2021-04-30 LAB — COMPREHENSIVE METABOLIC PANEL
ALT: 10 U/L (ref 0–44)
AST: 18 U/L (ref 15–41)
Albumin: 3.8 g/dL (ref 3.5–5.0)
Alkaline Phosphatase: 93 U/L (ref 38–126)
Anion gap: 8 (ref 5–15)
BUN: 12 mg/dL (ref 8–23)
CO2: 26 mmol/L (ref 22–32)
Calcium: 8.8 mg/dL — ABNORMAL LOW (ref 8.9–10.3)
Chloride: 103 mmol/L (ref 98–111)
Creatinine, Ser: 0.71 mg/dL (ref 0.44–1.00)
GFR, Estimated: >60 mL/min (ref >60)
Glucose, Bld: 109 mg/dL — ABNORMAL HIGH (ref 70–99)
Potassium: 3.2 mmol/L — ABNORMAL LOW (ref 3.5–5.1)
Sodium: 137 mmol/L (ref 135–145)
Total Bilirubin: 0.5 mg/dL (ref 0.3–1.2)
Total Protein: 7.4 g/dL (ref 6.5–8.1)

## 2021-04-30 LAB — PROTIME-INR
INR: 1.1 (ref 0.8–1.2)
Prothrombin Time: 13.7 seconds (ref 11.4–15.2)

## 2021-04-30 LAB — URINALYSIS, ROUTINE W REFLEX MICROSCOPIC
Bilirubin Urine: NEGATIVE
Glucose, UA: NEGATIVE mg/dL
Ketones, ur: NEGATIVE mg/dL
Nitrite: NEGATIVE
Protein, ur: NEGATIVE mg/dL
Specific Gravity, Urine: 1.006 (ref 1.005–1.030)
pH: 8 (ref 5.0–8.0)

## 2021-04-30 LAB — LACTIC ACID, PLASMA: Lactic Acid, Venous: 1 mmol/L (ref 0.5–1.9)

## 2021-04-30 LAB — APTT: aPTT: 25 seconds (ref 24–36)

## 2021-04-30 MED ORDER — SODIUM CHLORIDE 0.9 % IV BOLUS
1000.0000 mL | Freq: Once | INTRAVENOUS | Status: AC
  Administered 2021-04-30: 1000 mL via INTRAVENOUS

## 2021-04-30 MED ORDER — POTASSIUM CHLORIDE CRYS ER 20 MEQ PO TBCR
40.0000 meq | EXTENDED_RELEASE_TABLET | Freq: Once | ORAL | Status: AC
  Administered 2021-04-30: 40 meq via ORAL
  Filled 2021-04-30: qty 2

## 2021-04-30 NOTE — ED Notes (Signed)
Pt granddaughter is at bedside at this time. Granddaughter states 5 weeks ago pt had "major" back surgery for which since she has been on two muscle relaxer at a time and oxycodone as needed for pain. Pt has taken medication on an empty stomach today and she is unsure if that has caused some of the issue. Pt is still difficult to arouse however is arousable by verbal stimuli.

## 2021-04-30 NOTE — ED Notes (Signed)
Spoke with pt granddaughter Marchelle Folks who is coming to get patient. Will leave pt in room until her arrival due to severe drowsiness and unsafe for pt to be in wheelchair in waiting room.

## 2021-04-30 NOTE — ED Triage Notes (Signed)
Pt arrives [facility] form home for lethargy and hypotension. Pt advised by PCP to take a double dose of her muscle relaxer today. Pt states this has happened before.

## 2021-04-30 NOTE — ED Provider Notes (Signed)
[facility] EMERGENCY DEPARTMENT Provider Note   CSN: [id number] Arrival date & time: 04/30/21  1811     History Chief Complaint  Patient presents with  . Hypotension    Amy Santos is a 75 y.o. female.  Patient stated that she took 2 of her muscle relaxers and has felt weak and her blood pressure was low  The history is provided by the patient and medical records. No language interpreter was used.  Weakness Severity:  Moderate Onset quality:  Sudden Timing:  Constant Progression:  Waxing and waning Chronicity:  New Context: not alcohol use   Relieved by:  Nothing Worsened by:  Nothing Ineffective treatments:  None tried Associated symptoms: no abdominal pain, no chest pain, no cough, no diarrhea, no frequency, no headaches and no seizures        Past Medical History:  Diagnosis Date  . Anxiety   . Arthritis    tingling down both legs, neuropathy    . Chest pain   . Chronic pain syndrome   . Depression   . Dyslipidemia   . GERD (gastroesophageal reflux disease)   . Head ache   . Heart murmur    MVP  . Hypertension   . Migraines   . Mitral valve prolapse   . Neuromuscular disorder (HCC)    neuropathy feet & legs   . Tobacco abuse     Patient Active Problem List   Diagnosis Date Noted  . Flatulence 06/16/2016  . Constipation 04/28/2016  . Abdominal pain, chronic, right upper quadrant 04/28/2016  . HYPERTENSION 11/22/2010  . PURE HYPERCHOLESTEROLEMIA 11/01/2010  . NONDEPENDENT TOBACCO USE DISORDER 11/01/2010  . CHRONIC PAIN SYNDROME 11/01/2010  . ESOPHAGEAL REFLUX 11/01/2010  . CHEST PAIN UNSPECIFIED 11/01/2010    Past Surgical History:  Procedure Laterality Date  . ABDOMINAL HYSTERECTOMY    . bunyunectomy    . CARPAL TUNNEL RELEASE Bilateral   . CATARACT EXTRACTION    . EYE SURGERY Bilateral   . hysterectomy---unknown    . SHOULDER ARTHROSCOPY Right      OB History   No obstetric history on file.     Family History  Problem  Relation Age of Onset  . Colon cancer Neg Hx     Social History   Tobacco Use  . Smoking status: Current Every Day Smoker    Packs/day: 0.50    Types: Cigarettes  . Smokeless tobacco: Never Used  Substance Use Topics  . Alcohol use: No    Alcohol/week: 0.0 standard drinks  . Drug use: No    Home Medications Prior to Admission medications   Medication Sig Start Date End Date Taking? Authorizing Provider  acetaminophen (TYLENOL) 500 MG tablet Take 500 mg daily as needed by mouth for moderate pain.    [provider]  albuterol (PROVENTIL HFA;VENTOLIN HFA) 108 (90 Base) MCG/ACT inhaler Inhale 2 puffs every 6 (six) hours as needed into the lungs for wheezing or shortness of breath.    [provider]  ALPRAZolam Prudy Feeler) 1 MG tablet Take 1 mg 3 (three) times daily as needed by mouth for anxiety.     [provider]  amLODipine (NORVASC) 5 MG tablet Take 5 mg daily before breakfast by mouth.     [provider]  aspirin 81 MG tablet Take 81 mg by mouth daily.    [provider]  Calcium-Vitamin D 600-125 MG-UNIT TABS Take 2 tablets by mouth daily.    [provider]  cholecalciferol (VITAMIN  D) 1000 units tablet Take 1,000 Units daily by mouth.    [provider]  citalopram (CELEXA) 20 MG tablet Take 20 mg daily by mouth.     [provider]  gabapentin (NEURONTIN) 800 MG tablet Take 400 mg 3 (three) times daily by mouth.    [provider]  loratadine (CLARITIN) 10 MG tablet Take 10 mg daily as needed by mouth.     [provider]  lubiprostone (AMITIZA) 8 MCG capsule Take 8 mcg daily as needed by mouth.     [provider]  Magnesium 400 MG CAPS Take 400 mg daily by mouth.     [provider]  meclizine (ANTIVERT) 25 MG tablet Take 25 mg 3 (three) times daily as needed by mouth for dizziness.    [provider]  mirtazapine (REMERON) 15 MG tablet Take 15 mg at bedtime as  needed by mouth (sleep).    [provider]  Multiple Vitamin (MULTIVITAMIN WITH MINERALS) TABS tablet Take 1 tablet daily by mouth.    [provider]  omeprazole (PRILOSEC) 20 MG capsule Take 1 capsule (20 mg total) by mouth daily. 04/28/16   Anice Paganini, NP  ondansetron (ZOFRAN) 4 MG tablet Take 1 tablet (4 mg total) by mouth every 8 (eight) hours as needed for nausea or vomiting. Patient not taking: Reported on 10/16/2017 05/20/16   Anice Paganini, NP  oxyCODONE-acetaminophen (PERCOCET) 10-325 MG tablet Take 1 tablet by mouth 4 (four) times daily as needed for pain.    [provider]  promethazine (PHENERGAN) 25 MG tablet TAKE 1/2 TABLET EVERY 6 HOURS AS NEEDED FOR NAUSEA AND VOMITING 05/24/16   Tiffany Kocher, PA-C  rOPINIRole (REQUIP) 2 MG tablet Take 4 mg by mouth at bedtime.  05/29/15   [provider]  sodium chloride (OCEAN) 0.65 % SOLN nasal spray Place 1 spray as needed into both nostrils for congestion.    [provider]    Allergies    Nyquil multi-symptom [pseudoeph-doxylamine-dm-apap]  Review of Systems   Review of Systems  Constitutional: Negative for appetite change and fatigue.  HENT: Negative for congestion, ear discharge and sinus pressure.   Eyes: Negative for discharge.  Respiratory: Negative for cough.   Cardiovascular: Negative for chest pain.  Gastrointestinal: Negative for abdominal pain and diarrhea.  Genitourinary: Negative for frequency and hematuria.  Musculoskeletal: Negative for back pain.  Skin: Negative for rash.  Neurological: Positive for weakness. Negative for seizures and headaches.  Psychiatric/Behavioral: Negative for hallucinations.    Physical Exam Updated Vital Signs BP 133/64   Pulse 70   Resp 17   Ht 5\' 1"  (1.549 m)   Wt 55.1 kg   SpO2 94%   BMI 22.95 kg/m   Physical Exam Vitals and nursing note reviewed.  Constitutional:      Appearance: She is well-developed.  HENT:     Head:  Normocephalic.     Nose: Nose normal.  Eyes:     General: No scleral icterus.    Conjunctiva/sclera: Conjunctivae normal.  Neck:     Thyroid: No thyromegaly.  Cardiovascular:     Rate and Rhythm: Normal rate and regular rhythm.     Heart sounds: No murmur heard. No friction rub. No gallop.   Pulmonary:     Breath sounds: No stridor. No wheezing or rales.  Chest:     Chest wall: No tenderness.  Abdominal:     General: There is no distension.  Tenderness: There is no abdominal tenderness. There is no rebound.  Musculoskeletal:        General: Normal range of motion.     Cervical back: Neck supple.  Lymphadenopathy:     Cervical: No cervical adenopathy.  Skin:    Findings: No erythema or rash.  Neurological:     Mental Status: She is alert and oriented to person, place, and time.     Motor: No abnormal muscle tone.     Coordination: Coordination normal.  Psychiatric:        Behavior: Behavior normal.     ED Results / Procedures / Treatments   Labs (all labs ordered are listed, but only abnormal results are displayed) Labs Reviewed  COMPREHENSIVE METABOLIC PANEL - Abnormal; Notable for the following components:      Result Value   Potassium 3.2 (*)    Glucose, Bld 109 (*)    Calcium 8.8 (*)    All other components within normal limits  CBC WITH DIFFERENTIAL/PLATELET - Abnormal; Notable for the following components:   WBC 11.7 (*)    RBC 3.74 (*)    Hemoglobin 10.6 (*)    HCT 34.5 (*)    Platelets 461 (*)    Neutro Abs 9.9 (*)    All other components within normal limits  URINALYSIS, ROUTINE W REFLEX MICROSCOPIC - Abnormal; Notable for the following components:   Color, Urine STRAW (*)    Hgb urine dipstick SMALL (*)    Leukocytes,Ua TRACE (*)    Bacteria, UA RARE (*)    All other components within normal limits  CULTURE, BLOOD (SINGLE)  URINE CULTURE  LACTIC ACID, PLASMA  PROTIME-INR  APTT  LACTIC ACID, PLASMA    EKG None  Radiology DG Chest Port 1  View  Result Date: 04/30/2021 CLINICAL DATA:  75 year old female with questionable sepsis. EXAM: PORTABLE CHEST 1 VIEW COMPARISON:  None FINDINGS: No focal consolidation, pleural effusion or pneumothorax. The cardiac silhouette is within limits. Atherosclerotic calcification of the aorta. Osteopenia with degenerative changes of the spine and shoulders. No acute osseous pathology. IMPRESSION: No active disease. Electronically Signed   By: Elgie Collard M.D.   On: 04/30/2021 19:14    Procedures Procedures   Medications Ordered in ED Medications  potassium chloride SA (KLOR-CON) CR tablet 40 mEq (has no administration in time range)  sodium chloride 0.9 % bolus 1,000 mL (1,000 mLs Intravenous New Bag/Given 04/30/21 1924)    ED Course  I have reviewed the triage vital signs and the nursing notes.  Pertinent labs & imaging results that were available during my care of the patient were reviewed by me and considered in my medical decision making (see chart for details).    MDM Rules/Calculators/A&P                          Patient with mild hypotension and hypokalemia.  Patient improved with fluids and she will follow-up with her PCP Final Clinical Impression(s) / ED Diagnoses Final diagnoses:  Weakness    Rx / DC Orders ED Discharge Orders    None       Bethann Berkshire, MD 04/30/21 2122

## 2021-04-30 NOTE — Discharge Instructions (Addendum)
Do not double dose your muscle relaxer medicine again.  Follow-up with your doctor if any problem

## 2021-04-30 NOTE — ED Notes (Signed)
Granddaughter: Baxter Flattery [phone] Son: Falen Lehrmann [phone]

## 2021-05-02 LAB — URINE CULTURE: Culture: <10000 — AB

## 2021-05-05 LAB — CULTURE, BLOOD (SINGLE)
Culture: NO GROWTH
Special Requests: ADEQUATE

## 2022-01-11 IMAGING — DX DG CHEST 1V PORT
1 series · 1 of 1 positions shown · non-contrast
Comparison: None

CLINICAL DATA: 74-year-old female with questionable sepsis.

EXAM:
PORTABLE CHEST 1 VIEW

[chest ap]
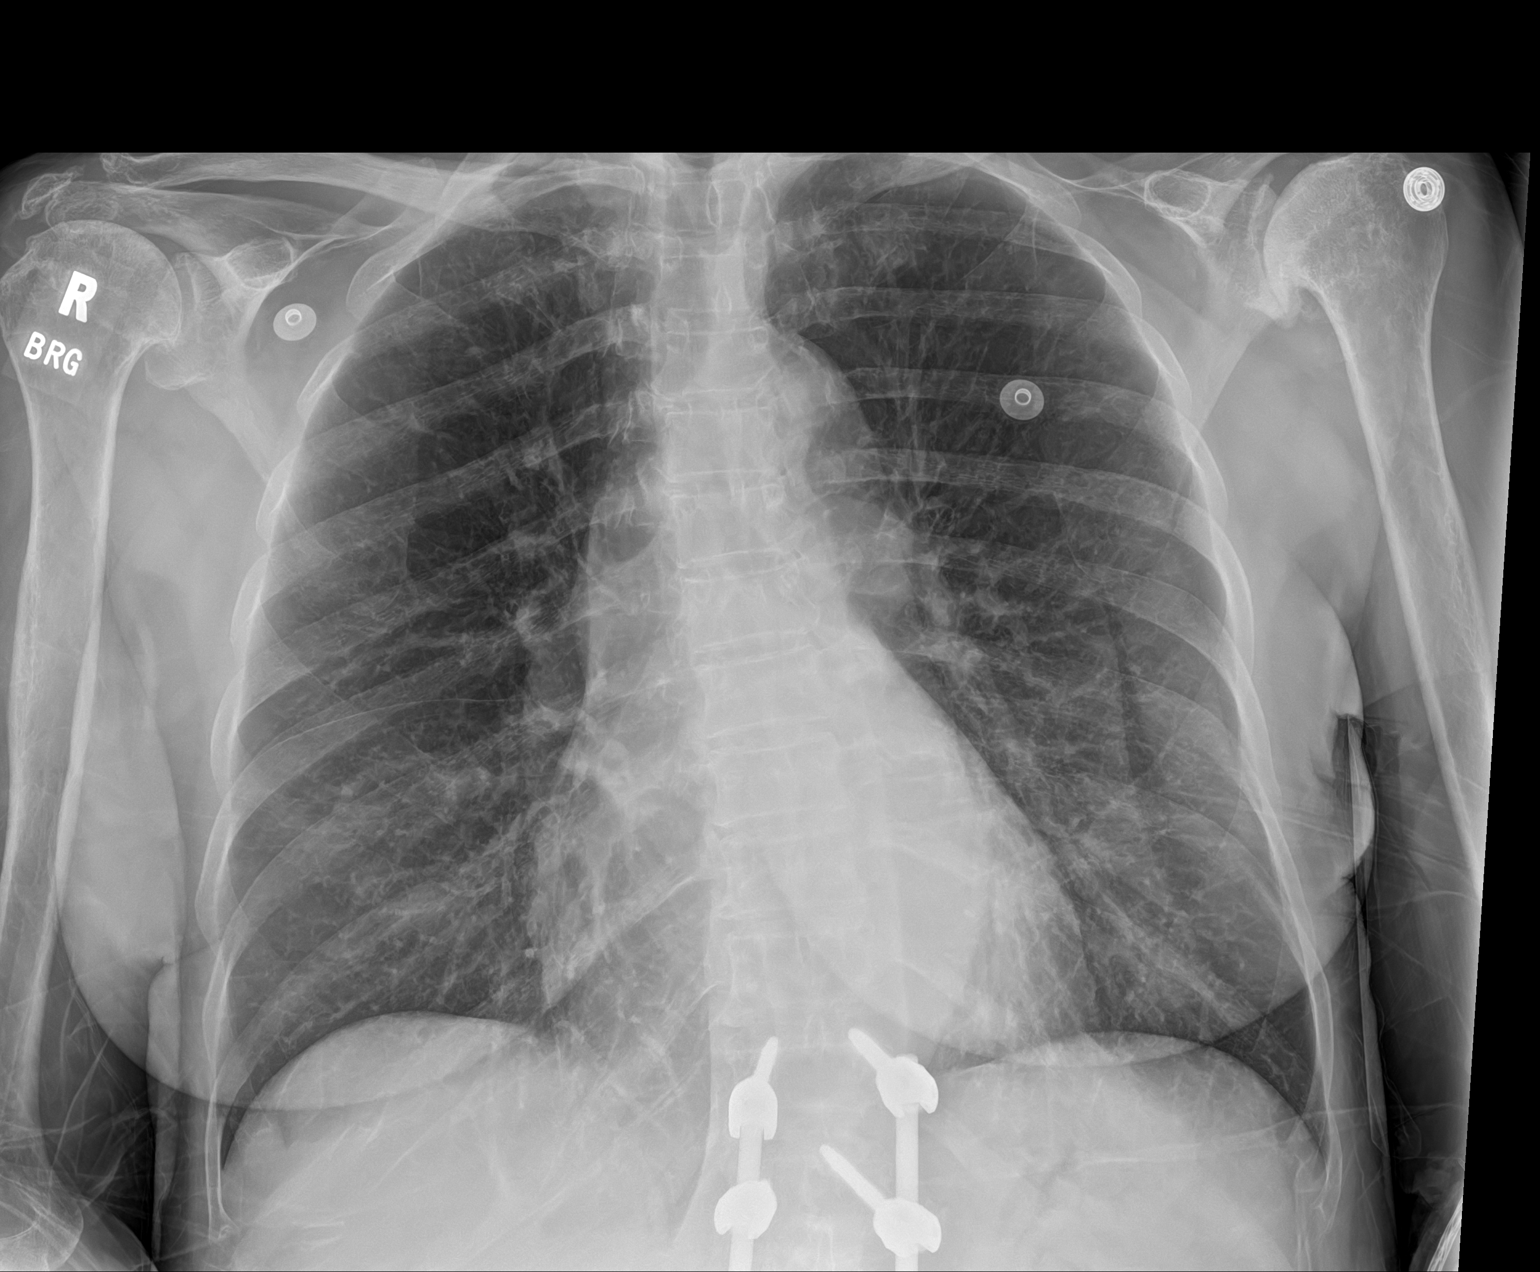

[1 of 1 positions shown; findings below may reference images not displayed]

FINDINGS: No focal consolidation, pleural effusion or pneumothorax. The
cardiac silhouette is within limits. Atherosclerotic calcification
of the aorta. Osteopenia with degenerative changes of the spine and
shoulders. No acute osseous pathology.
IMPRESSION: No active disease.

## 2022-11-27 ENCOUNTER — Other Ambulatory Visit: Payer: Self-pay

## 2022-11-27 ENCOUNTER — Emergency Department (HOSPITAL_COMMUNITY)
Admission: EM | Admit: 2022-11-27 | Discharge: 2022-11-28 | Discharge status: Against Medical Advice | Payer: Medicare Other | Attending: Emergency Medicine | Admitting: Emergency Medicine

## 2022-11-27 DIAGNOSIS — G8929 Other chronic pain: Secondary | ICD-10-CM | POA: Diagnosis not present

## 2022-11-27 DIAGNOSIS — M549 Dorsalgia, unspecified: Secondary | ICD-10-CM | POA: Diagnosis present

## 2022-11-27 DIAGNOSIS — Z5321 Procedure and treatment not carried out due to patient leaving prior to being seen by health care provider: Secondary | ICD-10-CM | POA: Insufficient documentation

## 2022-11-27 NOTE — ED Triage Notes (Signed)
Pt presents with chronic back pain for several months, history of back surgery.

## 2022-12-18 ENCOUNTER — Other Ambulatory Visit: Payer: Self-pay

## 2022-12-18 ENCOUNTER — Emergency Department (HOSPITAL_BASED_OUTPATIENT_CLINIC_OR_DEPARTMENT_OTHER)
Admission: EM | Admit: 2022-12-18 | Discharge: 2022-12-18 | Disposition: A | Discharge status: Home / Self Care | Payer: 59 | Attending: Emergency Medicine | Admitting: Emergency Medicine

## 2022-12-18 ENCOUNTER — Emergency Department (HOSPITAL_BASED_OUTPATIENT_CLINIC_OR_DEPARTMENT_OTHER): Payer: 59 | Admitting: Radiology

## 2022-12-18 ENCOUNTER — Encounter (HOSPITAL_BASED_OUTPATIENT_CLINIC_OR_DEPARTMENT_OTHER): Payer: Self-pay | Admitting: Emergency Medicine

## 2022-12-18 DIAGNOSIS — M545 Low back pain, unspecified: Secondary | ICD-10-CM | POA: Diagnosis not present

## 2022-12-18 DIAGNOSIS — Z7982 Long term (current) use of aspirin: Secondary | ICD-10-CM | POA: Diagnosis not present

## 2022-12-18 DIAGNOSIS — Z981 Arthrodesis status: Secondary | ICD-10-CM | POA: Insufficient documentation

## 2022-12-18 DIAGNOSIS — M5442 Lumbago with sciatica, left side: Secondary | ICD-10-CM

## 2022-12-18 MED ORDER — LIDOCAINE 5 % EX PTCH
1.0000 | MEDICATED_PATCH | CUTANEOUS | Status: DC
  Administered 2022-12-18: 1 via TRANSDERMAL
  Filled 2022-12-18: qty 1

## 2022-12-18 MED ORDER — HYDROCODONE-ACETAMINOPHEN 5-325 MG PO TABS
1.0000 | ORAL_TABLET | Freq: Once | ORAL | Status: AC
  Administered 2022-12-18: 1 via ORAL
  Filled 2022-12-18: qty 1

## 2022-12-18 NOTE — ED Notes (Signed)
Provider requested Pt be ambulated. Pt was ambulated, had no apparent problem, did stated that there was still slight pain but the medication that was given helped. Pt stated that she had no feeling of lightheaded/faint, SOB, CP or any other aliment. Provider notified.

## 2022-12-18 NOTE — ED Notes (Signed)
Post void - 74ml.

## 2022-12-18 NOTE — ED Provider Notes (Signed)
[facility] EMERGENCY DEPT Provider Note   CSN: [id number] Arrival date & time: 12/18/22  0840     History  Chief Complaint  Patient presents with   Back Pain    Amy Santos is a 77 y.o. female.  77 year old female with a history of degenerative disc disease and scoliosis status post T12-S1 scoliosis deformity correction, lumbar fusion at L2-L3, L4-L5 who presents to the emergency department with back pain.  For the past several months has had progressively worsening back pain in her lumbar spine and sacrum area.  Radiates down her left leg and is tingling.  Says that the pain has become unbearable and now ambulation has become difficult due to the pain.  Has not tried any pain meds at home because she believes that they will not help.  No known injuries but does take care of her husband who has dementia and will occasionally have to lift him off the ground and also carry firewood.  Denies any bowel or bladder incontinence.  No new numbness or saddle anesthesia.  No numbness while wiping after using the restroom.  No history of hemodialysis, long-term indwelling catheters, or IV drug use.  Not on anticoagulation.  No personal history of cancer.       Home Medications Prior to Admission medications   Medication Sig Start Date End Date Taking? Authorizing Provider  acetaminophen (TYLENOL) 500 MG tablet Take 500 mg daily as needed by mouth for moderate pain.    [provider]  albuterol (PROVENTIL HFA;VENTOLIN HFA) 108 (90 Base) MCG/ACT inhaler Inhale 2 puffs every 6 (six) hours as needed into the lungs for wheezing or shortness of breath.    [provider]  ALPRAZolam Prudy Feeler) 1 MG tablet Take 1 mg 3 (three) times daily as needed by mouth for anxiety.     [provider]  amLODipine (NORVASC) 5 MG tablet Take 5 mg daily before breakfast by mouth.     [provider]  aspirin 81 MG tablet Take 81 mg by mouth daily.    [provider]  Calcium-Vitamin D 600-125 MG-UNIT TABS Take 2 tablets by mouth daily.    [provider]  cholecalciferol (VITAMIN D) 1000 units tablet Take 1,000 Units daily by mouth.    [provider]  citalopram (CELEXA) 20 MG tablet Take 20 mg daily by mouth.     [provider]  gabapentin (NEURONTIN) 800 MG tablet Take 400 mg 3 (three) times daily by mouth.    [provider]  loratadine (CLARITIN) 10 MG tablet Take 10 mg daily as needed by mouth.     [provider]  lubiprostone (AMITIZA) 8 MCG capsule Take 8 mcg daily as needed by mouth.     [provider]  Magnesium 400 MG CAPS Take 400 mg daily by mouth.     [provider]  meclizine (ANTIVERT) 25 MG tablet Take 25 mg 3 (three) times daily as needed by mouth for dizziness.    [provider]  mirtazapine (REMERON) 15 MG tablet Take 15 mg at bedtime as needed by mouth (sleep).    [provider]  Multiple Vitamin (MULTIVITAMIN WITH MINERALS) TABS tablet Take 1 tablet daily by mouth.    [provider]  omeprazole (PRILOSEC) 20 MG capsule Take 1 capsule (20 mg total) by mouth daily. 04/28/16   Anice Paganini, NP  ondansetron (ZOFRAN) 4 MG tablet Take 1 tablet (4 mg total) by mouth every 8 (eight)  hours as needed for nausea or vomiting. Patient not taking: Reported on 10/16/2017 05/20/16   Carlis Stable, NP  oxyCODONE-acetaminophen (PERCOCET) 10-325 MG tablet Take 1 tablet by mouth 4 (four) times daily as needed for pain.    [provider]  promethazine (PHENERGAN) 25 MG tablet TAKE 1/2 TABLET EVERY 6 HOURS AS NEEDED FOR NAUSEA AND VOMITING 05/24/16   Mahala Menghini, PA-C  rOPINIRole (REQUIP) 2 MG tablet Take 4 mg by mouth at bedtime.  05/29/15   [provider]  sodium chloride (OCEAN) 0.65 % SOLN nasal spray Place 1 spray as needed into both nostrils for congestion.    [provider]      Allergies    Nyquil multi-symptom  [pseudoeph-doxylamine-dm-apap]    Review of Systems   Review of Systems  Physical Exam Updated Vital Signs BP 125/80 (BP Location: Right Arm)   Pulse 73   Temp 97.9 F (36.6 C) (Oral)   Resp 18   SpO2 96%   Physical Exam Constitutional:      General: She is not in acute distress.    Appearance: Normal appearance. She is not ill-appearing.  HENT:     Head: Normocephalic and atraumatic.     Right Ear: External ear normal.     Left Ear: External ear normal.     Mouth/Throat:     Mouth: Mucous membranes are moist.     Pharynx: Oropharynx is clear.  Eyes:     Extraocular Movements: Extraocular movements intact.     Conjunctiva/sclera: Conjunctivae normal.     Pupils: Pupils are equal, round, and reactive to light.  Cardiovascular:     Rate and Rhythm: Normal rate and regular rhythm.  Pulmonary:     Effort: Pulmonary effort is normal. No respiratory distress.  Musculoskeletal:        General: No deformity. Normal range of motion.     Cervical back: No rigidity or tenderness.  Neurological:     Mental Status: She is alert and oriented to person, place, and time. Mental status is at baseline.     Comments: No tenderness to palpation of midline thoracic or lumbar spine.  Scars from prior lumbar fusion present.  No step-offs palpated.  Motor: Muscle bulk and tone are normal. Strength is 5/5 in hip flexion, knee flexion and extension, ankle dorsiflexion and plantar flexion bilaterally. Full strength of great toe dorsiflexion bilaterally.  Sensory: Intact sensation to light touch in L2 though S1 dermatomes bilaterally.     ED Results / Procedures / Treatments   Labs (all labs ordered are listed, but only abnormal results are displayed) Labs Reviewed - No data to display  EKG None  Radiology DG Lumbar Spine Complete  Result Date: 12/18/2022 CLINICAL DATA:  Lumbar back pain with radiculopathy. Patient reports chronic left-sided low back pain. Back surgery in 2022. EXAM:  LUMBAR SPINE - COMPLETE 4+ VIEW COMPARISON:  Report from lumbar radiograph and MRI 06/05/2020, images unavailable. FINDINGS: Five non-rib-bearing lumbar vertebra. Posterior fusion hardware extends from T12 through S1. There are interbody spacers at L2-L3 and L4-L5. The spacer at L2-L3 may be displaced anteriorly. Acuity is uncertain. The hardware is otherwise intact. Mild L3-L4 disc space narrowing. Mild scoliotic curvature. Vertebral body heights are normal. No evidence of fracture or bone destruction. The bones are subjectively under mineralized. Aortic atherosclerosis. IMPRESSION: Posterior fusion hardware from T12 through S1. Interbody spacers at L2-L3 and L4-L5. The spacer at L2-L3 may be displaced anteriorly. Acuity is uncertain. Electronically Signed  By: Narda Rutherford M.D.   On: 12/18/2022 10:22    Procedures Procedures   Medications Ordered in ED Medications  HYDROcodone-acetaminophen (NORCO/VICODIN) 5-325 MG per tablet 1 tablet (1 tablet Oral Given 12/18/22 0948)    ED Course/ Medical Decision Making/ A&P Clinical Course as of 12/18/22 1737  Sun Dec 18, 2022  1044 DG Lumbar Spine Complete IMPRESSION: Posterior fusion hardware from T12 through S1. Interbody spacers at L2-L3 and L4-L5. The spacer at L2-L3 may be displaced anteriorly. Acuity is uncertain. [RP]  1131 45 mL PVR.  Patient ambulated without difficulty.  Attempted to call the patient's spinal surgeon but their office is closed today and unable to reach them. [RP]    Clinical Course User Index [RP] Rondel Baton, MD                            Medical Decision Making Amount and/or Complexity of Data Reviewed Radiology: ordered. Decision-making details documented in ED Course.  Risk Prescription drug management.   Amy Santos is a 77 y.o. female with comorbidities that complicate the patient evaluation including degenerative disc disease and scoliosis status post T12-S1 scoliosis deformity correction,  lumbar fusion at L2-L3, L4-L5 who presents to the emergency department with back pain.   Initial Ddx:  Lumbar radiculopathy, spinal cord compression, hardware malfunction, pathologic fracture  MDM:  Concerned about possible lumbar radiculopathy given the patient's symptoms.  No signs of spinal cord compression on exam and will obtain postvoid residual to ensure that this is not the case.  Will get x-rays to evaluate for pathologic fracture or hardware malfunction.  Plan:  Lumbar spine x-ray Norco Lidocaine patch Postvoid residual  ED Summary/Re-evaluation:  Patient monitored in the emergency department and had improved pain after the above interventions.  Was able to ambulate without difficulty.  Postvoid residual was 45 mL.  Do not feel that spinal cord compression is likely and that she needs emergent MRI today.  X-ray was obtained which did not show any acute fracture but did show anterior placement of her L2/L3 spacer of unclear chronicity.  Patient was informed of this finding and was instructed to follow-up with her spine surgeon after I attempted to contact them but was unable to since her office was closed.  Patient given instructions to take pain medication at home as well as lidocaine patch.  Counseled on return precautions prior to discharge.  This patient presents to the ED for concern of complaints listed in HPI, this involves an extensive number of treatment options, and is a complaint that carries with it a high risk of complications and morbidity. Disposition including potential need for admission considered.   Dispo: DC Home. Return precautions discussed including, but not limited to, those listed in the AVS. Allowed pt time to ask questions which were answered fully prior to dc.  Additional history obtained from daughter Records reviewed Outpatient Clinic Notes and ED Visit Notes I independently reviewed the following imaging with scope of interpretation limited to  determining acute life threatening conditions related to emergency care:  Lumbar spine x-ray  and agree with the radiologist interpretation with the following exceptions: None I personally reviewed and interpreted cardiac monitoring: normal sinus rhythm  I personally reviewed and interpreted the pt's EKG: see above for interpretation  I have reviewed the patients home medications and made adjustments as needed  Final Clinical Impression(s) / ED Diagnoses Final diagnoses:  Acute left-sided low back pain with  left-sided sciatica  History of spinal fusion    Rx / DC Orders ED Discharge Orders     None         Fransico Meadow, MD 12/18/22 [phone]

## 2022-12-18 NOTE — ED Notes (Signed)
Discharge paperwork given and verbally understood. 

## 2022-12-18 NOTE — ED Triage Notes (Signed)
Lower back pain for months, 2022 had major back surgery. 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

## 2022-12-18 NOTE — Discharge Instructions (Addendum)
You were seen for your back pain in the emergency department.   At home, please take the pain medications that you are prescribed and use your lidocaine patches for your back pain.    Follow-up with your primary doctor in 2-3 days regarding your visit.  Please also follow-up with your neurosurgeon as soon as possible and talk to them about your x-ray which showed that your L2-L3 spacer may have shifted slightly anteriorly.  Return immediately to the emergency department if you experience any of the following: New weakness, bowel or bladder incontinence, or any other concerning symptoms.    Thank you for visiting our Emergency Department. It was a pleasure taking care of you today.

## 2023-04-15 ENCOUNTER — Other Ambulatory Visit: Payer: Self-pay

## 2023-04-15 ENCOUNTER — Emergency Department (HOSPITAL_COMMUNITY): Payer: 59

## 2023-04-15 ENCOUNTER — Emergency Department (HOSPITAL_COMMUNITY)
Admission: EM | Admit: 2023-04-15 | Discharge: 2023-04-15 | Disposition: A | Discharge status: Home / Self Care | Payer: 59 | Attending: Emergency Medicine | Admitting: Emergency Medicine

## 2023-04-15 DIAGNOSIS — Z79899 Other long term (current) drug therapy: Secondary | ICD-10-CM | POA: Insufficient documentation

## 2023-04-15 DIAGNOSIS — E860 Dehydration: Secondary | ICD-10-CM | POA: Insufficient documentation

## 2023-04-15 DIAGNOSIS — K529 Noninfective gastroenteritis and colitis, unspecified: Secondary | ICD-10-CM | POA: Insufficient documentation

## 2023-04-15 DIAGNOSIS — I10 Essential (primary) hypertension: Secondary | ICD-10-CM | POA: Diagnosis not present

## 2023-04-15 DIAGNOSIS — Z7982 Long term (current) use of aspirin: Secondary | ICD-10-CM | POA: Insufficient documentation

## 2023-04-15 DIAGNOSIS — R197 Diarrhea, unspecified: Secondary | ICD-10-CM | POA: Diagnosis present

## 2023-04-15 LAB — CBC
HCT: 36.8 % (ref 36.0–46.0)
Hemoglobin: 12.3 g/dL (ref 12.0–15.0)
MCH: 32.7 pg (ref 26.0–34.0)
MCHC: 33.4 g/dL (ref 30.0–36.0)
MCV: 97.9 fL (ref 80.0–100.0)
Platelets: 210 10*3/uL (ref 150–400)
RBC: 3.76 MIL/uL — ABNORMAL LOW (ref 3.87–5.11)
RDW: 12.9 % (ref 11.5–15.5)
WBC: 12.2 10*3/uL — ABNORMAL HIGH (ref 4.0–10.5)
nRBC: 0 % (ref 0.0–0.2)

## 2023-04-15 LAB — BASIC METABOLIC PANEL
Anion gap: 9 (ref 5–15)
BUN: 14 mg/dL (ref 8–23)
CO2: 22 mmol/L (ref 22–32)
Calcium: 8.2 mg/dL — ABNORMAL LOW (ref 8.9–10.3)
Chloride: 107 mmol/L (ref 98–111)
Creatinine, Ser: 0.85 mg/dL (ref 0.44–1.00)
GFR, Estimated: >60 mL/min (ref >60)
Glucose, Bld: 96 mg/dL (ref 70–99)
Potassium: 3.2 mmol/L — ABNORMAL LOW (ref 3.5–5.1)
Sodium: 138 mmol/L (ref 135–145)

## 2023-04-15 LAB — URINALYSIS, ROUTINE W REFLEX MICROSCOPIC
Bilirubin Urine: NEGATIVE
Glucose, UA: NEGATIVE mg/dL
Ketones, ur: NEGATIVE mg/dL
Leukocytes,Ua: NEGATIVE
Nitrite: NEGATIVE
Protein, ur: NEGATIVE mg/dL
Specific Gravity, Urine: 1.016 (ref 1.005–1.030)
pH: 7 (ref 5.0–8.0)

## 2023-04-15 LAB — CBG MONITORING, ED: Glucose-Capillary: 106 mg/dL — ABNORMAL HIGH (ref 70–99)

## 2023-04-15 MED ORDER — IOHEXOL 300 MG/ML  SOLN
100.0000 mL | Freq: Once | INTRAMUSCULAR | Status: AC | PRN
  Administered 2023-04-15: 100 mL via INTRAVENOUS

## 2023-04-15 MED ORDER — AMOXICILLIN-POT CLAVULANATE 875-125 MG PO TABS
1.0000 | ORAL_TABLET | Freq: Once | ORAL | Status: AC
  Administered 2023-04-15: 1 via ORAL
  Filled 2023-04-15: qty 1

## 2023-04-15 MED ORDER — SODIUM CHLORIDE 0.9 % IV BOLUS
1000.0000 mL | Freq: Once | INTRAVENOUS | Status: AC
  Administered 2023-04-15: 1000 mL via INTRAVENOUS

## 2023-04-15 MED ORDER — ONDANSETRON 4 MG PO TBDP: ORAL_TABLET | ORAL | 0 refills | Status: DC

## 2023-04-15 MED ORDER — POTASSIUM CHLORIDE CRYS ER 20 MEQ PO TBCR
40.0000 meq | EXTENDED_RELEASE_TABLET | Freq: Once | ORAL | Status: AC
  Administered 2023-04-15: 40 meq via ORAL
  Filled 2023-04-15: qty 2

## 2023-04-15 MED ORDER — AMOXICILLIN-POT CLAVULANATE 875-125 MG PO TABS: 1.0000 | ORAL_TABLET | Freq: Two times a day (BID) | ORAL | 0 refills | Status: DC

## 2023-04-15 NOTE — ED Triage Notes (Signed)
Pt to ED via [facility] from home. Family called due to near syncopal episode. No fall or LOC. Pt a/o x4 on arrival to ED. EMS stated pt was initially hypotensive (65/38) and bradycardic (40s-50s). Pt received 1500 mL fluid bolus and VS improved. BP 124/65 HR 84 on arrival to ED.

## 2023-04-15 NOTE — Discharge Instructions (Signed)
Drink plenty of fluids and follow-up with your family doctor or the gastroenterologist I have referred you to next week

## 2023-04-15 NOTE — ED Provider Notes (Signed)
[place] EMERGENCY DEPARTMENT AT [facility] Provider Note   CSN: [id number] Arrival date & time: 04/15/23  2013     History {Add pertinent medical, surgical, social history, OB history to HPI:1} Chief Complaint  Patient presents with   Near Syncope    Amy Santos is a 77 y.o. female.  Patient has a history of hypertension.  She complains of weakness and nausea and some loose stools.  Patient states this happens to her frequently   Near Syncope       Home Medications Prior to Admission medications   Medication Sig Start Date End Date Taking? Authorizing Provider  amoxicillin-clavulanate (AUGMENTIN) 875-125 MG tablet Take 1 tablet by mouth every 12 (twelve) hours. 04/15/23  Yes Bethann Berkshire, MD  ondansetron (ZOFRAN-ODT) 4 MG disintegrating tablet 4mg  ODT q4 hours prn nausea/vomit 04/15/23  Yes Bethann Berkshire, MD  acetaminophen (TYLENOL) 500 MG tablet Take 500 mg daily as needed by mouth for moderate pain.    [provider]  albuterol (PROVENTIL HFA;VENTOLIN HFA) 108 (90 Base) MCG/ACT inhaler Inhale 2 puffs every 6 (six) hours as needed into the lungs for wheezing or shortness of breath.    [provider]  ALPRAZolam Prudy Feeler) 1 MG tablet Take 1 mg 3 (three) times daily as needed by mouth for anxiety.     [provider]  amLODipine (NORVASC) 5 MG tablet Take 5 mg daily before breakfast by mouth.     [provider]  aspirin 81 MG tablet Take 81 mg by mouth daily.    [provider]  Calcium-Vitamin D 600-125 MG-UNIT TABS Take 2 tablets by mouth daily.    [provider]  cholecalciferol (VITAMIN D) 1000 units tablet Take 1,000 Units daily by mouth.    [provider]  citalopram (CELEXA) 20 MG tablet Take 20 mg daily by mouth.     [provider]  gabapentin (NEURONTIN) 800 MG tablet Take 400 mg 3 (three) times daily by mouth.    [provider]  loratadine (CLARITIN) 10 MG  tablet Take 10 mg daily as needed by mouth.     [provider]  lubiprostone (AMITIZA) 8 MCG capsule Take 8 mcg daily as needed by mouth.     [provider]  Magnesium 400 MG CAPS Take 400 mg daily by mouth.     [provider]  meclizine (ANTIVERT) 25 MG tablet Take 25 mg 3 (three) times daily as needed by mouth for dizziness.    [provider]  mirtazapine (REMERON) 15 MG tablet Take 15 mg at bedtime as needed by mouth (sleep).    [provider]  Multiple Vitamin (MULTIVITAMIN WITH MINERALS) TABS tablet Take 1 tablet daily by mouth.    [provider]  omeprazole (PRILOSEC) 20 MG capsule Take 1 capsule (20 mg total) by mouth daily. 04/28/16   Anice Paganini, NP  ondansetron (ZOFRAN) 4 MG tablet Take 1 tablet (4 mg total) by mouth every 8 (eight) hours as needed for nausea or vomiting. Patient not taking: Reported on 10/16/2017 05/20/16   Anice Paganini, NP  oxyCODONE-acetaminophen (PERCOCET) 10-325 MG tablet Take 1 tablet by mouth 4 (four) times daily as needed for pain.    [provider]  promethazine (PHENERGAN) 25 MG tablet TAKE 1/2 TABLET EVERY 6 HOURS AS NEEDED FOR NAUSEA AND VOMITING 05/24/16   Tiffany Kocher, PA-C  rOPINIRole (REQUIP) 2 MG tablet Take 4 mg by mouth at bedtime.  05/29/15  [provider]  sodium chloride (OCEAN) 0.65 % SOLN nasal spray Place 1 spray as needed into both nostrils for congestion.    [provider]      Allergies    Nyquil multi-symptom [pseudoeph-doxylamine-dm-apap]    Review of Systems   Review of Systems  Cardiovascular:  Positive for near-syncope.    Physical Exam Updated Vital Signs BP 124/65   Pulse 85   Temp 98.2 F (36.8 C) (Oral)   Resp 18   Ht 5\' 2"  (1.575 m)   Wt 54.4 kg   SpO2 99%   BMI 21.95 kg/m  Physical Exam  ED Results / Procedures / Treatments   Labs (all labs ordered are listed, but only abnormal results are displayed) Labs Reviewed  BASIC  METABOLIC PANEL - Abnormal; Notable for the following components:      Result Value   Potassium 3.2 (*)    Calcium 8.2 (*)    All other components within normal limits  CBC - Abnormal; Notable for the following components:   WBC 12.2 (*)    RBC 3.76 (*)    All other components within normal limits  CBG MONITORING, ED - Abnormal; Notable for the following components:   Glucose-Capillary 106 (*)    All other components within normal limits  URINALYSIS, ROUTINE W REFLEX MICROSCOPIC    EKG None  Radiology CT ABDOMEN PELVIS W CONTRAST  Result Date: 04/15/2023 CLINICAL DATA:  Abdominal pain, acute, nonlocalized. Syncopal episode, hypotension. EXAM: CT ABDOMEN AND PELVIS WITH CONTRAST TECHNIQUE: Multidetector CT imaging of the abdomen and pelvis was performed using the standard protocol following bolus administration of intravenous contrast. RADIATION DOSE REDUCTION: This exam was performed according to the departmental dose-optimization program which includes automated exposure control, adjustment of the mA and/or kV according to patient size and/or use of iterative reconstruction technique. CONTRAST:  OMNIPAQUE IOHEXOL 300 MG/ML  SOLN COMPARISON:  03/17/2016. FINDINGS: Lower chest: Multi-vessel coronary artery calcifications are noted. Atelectasis is present at the lung bases. Hepatobiliary: No focal liver abnormality is seen. Mild fatty infiltration of the liver is noted. No gallstones, gallbladder wall thickening, or biliary dilatation. Pancreas: Unremarkable. No pancreatic ductal dilatation or surrounding inflammatory changes. Spleen: Normal in size without focal abnormality. Adrenals/Urinary Tract: No adrenal nodule or mass. The kidneys enhance symmetrically. No renal calculus or hydronephrosis. The bladder is unremarkable. Stomach/Bowel: Stomach is within normal limits. Appendix appears normal. No bowel obstruction. No free air or pneumatosis. Scattered diverticula are present along the  colon without evidence of diverticulitis. There is segmental colonic wall thickening at the sigmoid colon with mild surrounding fat stranding. Vascular/Lymphatic: Aortic atherosclerosis. No enlarged abdominal or pelvic lymph nodes. Reproductive: Status post hysterectomy. No adnexal masses. Other: No abdominopelvic ascites. Musculoskeletal: Degenerative changes and spinal fusion hardware are present in the thoracolumbar spine. No acute osseous abnormality. IMPRESSION: 1. Mild thickening of the walls of the sigmoid colon with minimal associated fat stranding, possible colitis. 2. Diverticulosis without diverticulitis. 3. Multi-vessel coronary artery calcifications and aortic atherosclerosis. Electronically Signed   By: Thornell Sartorius M.D.   On: 04/15/2023 22:02    Procedures Procedures  {Document cardiac monitor, telemetry assessment procedure when appropriate:1}  Medications Ordered in ED Medications  amoxicillin-clavulanate (AUGMENTIN) 875-125 MG per tablet 1 tablet (has no administration in time range)  potassium chloride SA (KLOR-CON M) CR tablet 40 mEq (has no administration in time range)  sodium chloride 0.9 % bolus 1,000 mL (1,000 mLs Intravenous New Bag/Given 04/15/23 2156)  iohexol (OMNIPAQUE)  300 MG/ML solution 100 mL (100 mLs Intravenous Contrast Given 04/15/23 2120)    ED Course/ Medical Decision Making/ A&P   {   Click here for ABCD2, HEART and other calculatorsREFRESH Note before signing :1}                          Medical Decision Making Amount and/or Complexity of Data Reviewed Labs: ordered. Radiology: ordered.  Risk Prescription drug management.   Patient with colitis.  She will be discharged on Augmentin and Zofran follow-up with PCP and GI  {Document critical care time when appropriate:1} {Document review of labs and clinical decision tools ie heart score, Chads2Vasc2 etc:1}  {Document your independent review of radiology images, and any outside records:1} {Document  your discussion with family members, caretakers, and with consultants:1} {Document social determinants of health affecting pt's care:1} {Document your decision making why or why not admission, treatments were needed:1} Final Clinical Impression(s) / ED Diagnoses Final diagnoses:  Colitis    Rx / DC Orders ED Discharge Orders          Ordered    amoxicillin-clavulanate (AUGMENTIN) 875-125 MG tablet  Every 12 hours        04/15/23 2238    ondansetron (ZOFRAN-ODT) 4 MG disintegrating tablet        04/15/23 2238

## 2023-04-19 NOTE — Progress Notes (Unsigned)
GI Office Note    Referring Provider: Rebekah Chesterfield, NP Primary Care Physician:  Rebekah Chesterfield, NP  Primary Gastroenterologist: Hennie Duos. Marletta Lor, DO, previously Dr. Darrick Penna  Chief Complaint   Chief Complaint  Patient presents with   er follow up    colitis   History of Present Illness   Amy Santos is a 77 y.o. female presenting today at the request of Rebekah Chesterfield, NP for ED follow up of colitis.    Recent ED visit 04/15/23 for weakness, nausea, and loose stools. Reported recurrent near syncope. Discharged on augmentin and zofran. Labs with mild leukocytosis WBC 12.2, normal Hgb. K 3.2.   CT A/P 04/15/23: -mild wall thickening of sigmoid colon with minimal fat stranding. -diverticulosis   Today: Prior colonoscopy - Dr. Druscilla Brownie?, has been more than 10 years. Had 3 polyps the first colonoscopy she has had that were benign.   Reports the night she went to the ED she had some nausea about 5 pm and felt like she was going have some syncope and sat down and laid down and symptoms continued. She started throwing up repeatedly and states her family called 911 since she was very sick and her BP was very low and they placed an IV and gave her IV fluids. States she had some looser stools during this time as well.   Reports she has had similar episodes before but states she has been caring for her husband who has dementia that is slowly progressing. Because of this she does not go to doctors very frequently.   She reports a history of syncope dating back many years. Had a syncopal episode about 2 months ago. Has vertigo constantly and takes meclizine usually nightly. Has not seen cardiology. Seen PCP in February and due to see him again in August.   On omeprazole for reflux and this works well for her. Has some mild intermittent issues with swallowing. Has trouble with bread and crinkle french fries and has to sip water after and feels like it gets a little stuck.  Does not happen regularly. Tends to happen if she eats very fast.   Has history of restless leg as well.   Has an issue with her mitral valve that she knows of diagnosed in her 30s.   Has been a little bit constipated. Typically goes every morning.    Current Outpatient Medications  Medication Sig Dispense Refill   acetaminophen (TYLENOL) 500 MG tablet Take 500 mg daily as needed by mouth for moderate pain.     albuterol (PROVENTIL HFA;VENTOLIN HFA) 108 (90 Base) MCG/ACT inhaler Inhale 2 puffs every 6 (six) hours as needed into the lungs for wheezing or shortness of breath.     ALPRAZolam (XANAX) 1 MG tablet Take 1 mg 3 (three) times daily as needed by mouth for anxiety.      amLODipine (NORVASC) 5 MG tablet Take 5 mg daily before breakfast by mouth.      amoxicillin-clavulanate (AUGMENTIN) 875-125 MG tablet Take 1 tablet by mouth every 12 (twelve) hours. 14 tablet 0   aspirin 81 MG tablet Take 81 mg by mouth daily.     Calcium-Vitamin D 600-125 MG-UNIT TABS Take 2 tablets by mouth daily.     cholecalciferol (VITAMIN D) 1000 units tablet Take 1,000 Units daily by mouth.     gabapentin (NEURONTIN) 800 MG tablet Take 400 mg 3 (three) times daily by mouth.     loratadine (CLARITIN) 10 MG tablet  Take 10 mg daily as needed by mouth.      Magnesium 400 MG CAPS Take 400 mg daily by mouth.      meclizine (ANTIVERT) 25 MG tablet Take 25 mg 3 (three) times daily as needed by mouth for dizziness.     Multiple Vitamin (MULTIVITAMIN WITH MINERALS) TABS tablet Take 1 tablet daily by mouth.     omeprazole (PRILOSEC) 20 MG capsule Take 1 capsule (20 mg total) by mouth daily. 90 capsule 3   ondansetron (ZOFRAN) 4 MG tablet Take 1 tablet (4 mg total) by mouth every 8 (eight) hours as needed for nausea or vomiting. 20 tablet 0   oxyCODONE-acetaminophen (PERCOCET) 10-325 MG tablet Take 1 tablet by mouth 4 (four) times daily as needed for pain.     rOPINIRole (REQUIP) 2 MG tablet Take 4 mg by mouth at bedtime.       sodium chloride (OCEAN) 0.65 % SOLN nasal spray Place 1 spray as needed into both nostrils for congestion.     No current facility-administered medications for this visit.    Past Medical History:  Diagnosis Date   Anxiety    Arthritis    tingling down both legs, neuropathy     Chest pain    Chronic pain syndrome    Depression    Dyslipidemia    GERD (gastroesophageal reflux disease)    Head ache    Heart murmur    MVP   Hypertension    Migraines    Mitral valve prolapse    Neuromuscular disorder (HCC)    neuropathy feet & legs    Tobacco abuse     Past Surgical History:  Procedure Laterality Date   ABDOMINAL HYSTERECTOMY     bunyunectomy     CARPAL TUNNEL RELEASE Bilateral    CATARACT EXTRACTION     EYE SURGERY Bilateral    hysterectomy---unknown     SHOULDER ARTHROSCOPY Right     Family History  Problem Relation Age of Onset   Colon cancer Neg Hx     Allergies as of 04/20/2023 - Review Complete 04/20/2023  Allergen Reaction Noted   Dextromethorphan  11/17/2020   Dextromethorphan hbr  11/17/2020   Doxylamine  11/17/2020   Nyquil multi-symptom [pseudoeph-doxylamine-dm-apap]  03/17/2016   Pseudoephedrine hcl  11/17/2020   Quetiapine  03/08/2021   Sulfacetamide  03/26/2021    Social History   Socioeconomic History   Marital status: Married    Spouse name: Not on file   Number of children: Not on file   Years of education: Not on file   Highest education level: Not on file  Occupational History   Occupation: retired  Tobacco Use   Smoking status: Every Day    Packs/day: .5    Types: Cigarettes   Smokeless tobacco: Never  Substance and Sexual Activity   Alcohol use: No    Alcohol/week: 0.0 standard drinks of alcohol   Drug use: No   Sexual activity: Not Currently  Other Topics Concern   Not on file  Social History Narrative   Not on file   Social Determinants of Health   Financial Resource Strain: Not on file  Food Insecurity: Not on  file  Transportation Needs: Not on file  Physical Activity: Not on file  Stress: Not on file  Social Connections: Not on file  Intimate Partner Violence: Not on file     Review of Systems   Gen: Denies any fever, chills, fatigue, weight loss, lack of appetite.  CV: Denies chest pain, heart palpitations, peripheral edema, syncope.  Resp: Denies shortness of breath at rest or with exertion. Denies wheezing or cough.  GI: see HPI GU : Denies urinary burning, urinary frequency, urinary hesitancy MS: Denies joint pain, muscle weakness, cramps, or limitation of movement.  Derm: Denies rash, itching, dry skin Psych: Denies depression, anxiety, memory loss, and confusion Heme: Denies bruising, bleeding, and enlarged lymph nodes.   Physical Exam   BP (!) 146/84   Pulse 78   Temp 98 F (36.7 C) (Oral)   Ht 5\' 2"  (1.575 m)   Wt 118 lb 12.8 oz (53.9 kg)   SpO2 99%   BMI 21.73 kg/m   General:   Alert and oriented. Pleasant and cooperative. Well-nourished and well-developed.  Head:  Normocephalic and atraumatic. Eyes:  Without icterus, sclera clear and conjunctiva pink.  Ears:  Normal auditory acuity. Mouth:  No deformity or lesions, oral mucosa pink.  Lungs:  Clear to auscultation bilaterally. No wheezes, rales, or rhonchi. No distress.  Heart:  S1, S2 present without murmurs appreciated.  Abdomen:  +BS, soft, non-tender and non-distended. No HSM noted. No guarding or rebound. No masses appreciated.  Rectal:  Deferred  Msk:  Symmetrical without gross deformities. Normal posture. Extremities:  Without edema. Neurologic:  Alert and  oriented x4;  grossly normal neurologically. Skin:  Intact without significant lesions or rashes. Psych:  Alert and cooperative. Normal mood and affect.   Assessment   Amy Santos is a 77 y.o. female with a history of constipation, GERD, anxiety, depression, dyslipidemia, HTN, neuropathy, chronic pain presenting today for ED follow up of  colitis.  History of colon polyps, Colitis: Reports prior colonoscopy greater than 10 years ago.  States her first colonoscopy that she had many years ago she had 3 polyps that were benign.  Recent ED visit with nausea, vomiting, and a couple of loose stools and near syncope.  She had CT which revealed mild wall thickening of the sigmoid colon with minimal fat stranding and diverticulosis without diverticulitis.  As noted below she has reported prior syncope or near syncope episodes in the past.  Denied any recent exposure to GI virus or other illness.  Given course of Augmentin for colitis and she has had significant improvement.  Not having any diarrhea or loose stools currently or nausea/vomiting.  Given she is well overdue for colonoscopy given her history of polyps and recent colitis we will plan to schedule this however we will need to obtain clearance from cardiology syncope episodes and concern for A-fib on recent EKG despite normal cardiac exam today.  GERD: GERD symptoms well-controlled with omeprazole 20 mg once daily.  Does have some occasional dysphagia however this is likely secondary to eating too quickly.  Symptoms were to become more frequent we could consider upper endoscopy.  Syncope: Experience near syncope prior to her last ED visit, states this occurs randomly multiple times before in the past and is always associated with nausea.  Felt as though her heart could have been racing last week as well.  EKG in the ED with concerns for A-fib however there was very minimal artifact.  Denies any chest pain or shortness of breath however she does have baseline vertigo for which she takes meclizine for once daily.  Will give referral for cardiology.  PLAN   Finish course of Augmentin. Continue omeprazole 20 mg once daily.  Proceed with colonoscopy with propofol by Dr. Marletta Lor  in near future: the risks, benefits,  and alternatives have been discussed with the patient in detail. The patient  states understanding and desires to proceed. ASA 2/3 Clearance from cardiology Cardiology referral to [facility] Can use stool softener or miralax as needed for constipation.  Follow up in 4 months   Brooke Bonito, MSN, FNP-BC, AGACNP-BC [facility]

## 2023-04-20 ENCOUNTER — Ambulatory Visit (INDEPENDENT_AMBULATORY_CARE_PROVIDER_SITE_OTHER): Payer: 59 | Admitting: Gastroenterology

## 2023-04-20 ENCOUNTER — Other Ambulatory Visit: Payer: Self-pay | Admitting: *Deleted

## 2023-04-20 ENCOUNTER — Encounter: Payer: Self-pay | Admitting: Gastroenterology

## 2023-04-20 VITALS — BP 146/84 | HR 78 | Temp 98.0°F | Ht 62.0 in | Wt 118.8 lb

## 2023-04-20 DIAGNOSIS — K219 Gastro-esophageal reflux disease without esophagitis: Secondary | ICD-10-CM | POA: Diagnosis not present

## 2023-04-20 DIAGNOSIS — R55 Syncope and collapse: Secondary | ICD-10-CM

## 2023-04-20 DIAGNOSIS — K529 Noninfective gastroenteritis and colitis, unspecified: Secondary | ICD-10-CM | POA: Diagnosis not present

## 2023-04-20 NOTE — Patient Instructions (Addendum)
Finish your course of Augmentin.  Continue omeprazole 20 mg once daily.  Will send a referral to cardiology for you given your syncope and slightly abnormal EKG in the ED.  It is reassuring that your cardiac exam today revealed a normal heart rhythm.   We will plan to schedule colonoscopy for you after you have been evaluated by cardiology.  Constipation continues he may use a stool softener or MiraLAX as needed.  If you begin can to have any worsening constipation after finishing your antibiotic or any abdominal pain, nausea, vomiting, or diarrhea please reach out to the office.  It was a pleasure to see you today. I want to create trusting relationships with patients. If you receive a survey regarding your visit,  I greatly appreciate you taking time to fill this out on paper or through your MyChart. I value your feedback.  Brooke Bonito, MSN, FNP-BC, AGACNP-BC [facility]

## 2023-04-27 ENCOUNTER — Telehealth: Payer: Self-pay | Admitting: *Deleted

## 2023-04-27 NOTE — Telephone Encounter (Signed)
Called and spoke with pt's grand daughter and she said that the number that was listed hadn't been in service for 4 years. I got an updated phone number. I updated it in pt's chart and called cardiology to inform them with pt's number. FYI

## 2023-04-27 NOTE — Telephone Encounter (Signed)
Pt was referred to cardiology on 04/20/23. They have not been able to get in contact with pt due to phone number not in service. Please advise. Thank you

## 2023-04-27 NOTE — Telephone Encounter (Signed)
noted 

## 2023-05-31 ENCOUNTER — Encounter: Payer: Self-pay | Admitting: *Deleted

## 2023-06-14 ENCOUNTER — Telehealth: Payer: Self-pay | Admitting: *Deleted

## 2023-06-14 NOTE — Telephone Encounter (Signed)
Pt has an appointment tomorrow.   Request for patient to stop medication prior to procedure or is needing cleareance  06/14/23  KHAMYA TOPP 04-30-1946  What type of surgery is being performed? Colonoscopy  When is surgery scheduled? TBD  What type of clearance is required (medical or pharmacy to hold medication or both? medical  Patient needs to have cardiac clearance prior to being scheduled for procedure.  Name of physician performing surgery?  Dr.Carver [facility] [employer]: [phone] Fax: [phone]  Anethesia type (none, local, MAC, general)? MAC

## 2023-06-14 NOTE — Telephone Encounter (Signed)
Appointment notes have been updated for surgical clearance.

## 2023-06-15 ENCOUNTER — Ambulatory Visit: Payer: 59 | Admitting: Internal Medicine

## 2023-06-15 NOTE — Progress Notes (Signed)
Erroneous encounter - please disregard.

## 2023-06-16 ENCOUNTER — Encounter: Payer: Self-pay | Admitting: Internal Medicine

## 2023-06-29 NOTE — Telephone Encounter (Signed)
Pt missed her cardiology appointment on 06/15/23. A letter was mailed out by cardiology for pt to call and reschedule appointment.

## 2023-06-30 NOTE — Telephone Encounter (Signed)
noted 

## 2023-07-05 ENCOUNTER — Ambulatory Visit: Payer: 59 | Admitting: Internal Medicine

## 2023-08-01 ENCOUNTER — Encounter: Payer: Self-pay | Admitting: Gastroenterology

## 2023-08-16 ENCOUNTER — Telehealth: Payer: Self-pay | Admitting: Internal Medicine

## 2023-08-16 ENCOUNTER — Other Ambulatory Visit: Payer: Self-pay | Admitting: Internal Medicine

## 2023-08-16 ENCOUNTER — Encounter: Payer: Self-pay | Admitting: Internal Medicine

## 2023-08-16 ENCOUNTER — Ambulatory Visit: Payer: 59 | Attending: Internal Medicine | Admitting: Internal Medicine

## 2023-08-16 ENCOUNTER — Other Ambulatory Visit: Payer: 59

## 2023-08-16 VITALS — BP 144/78 | HR 79 | Ht 62.0 in | Wt 114.0 lb

## 2023-08-16 DIAGNOSIS — R42 Dizziness and giddiness: Secondary | ICD-10-CM | POA: Insufficient documentation

## 2023-08-16 DIAGNOSIS — Z01818 Encounter for other preprocedural examination: Secondary | ICD-10-CM | POA: Diagnosis not present

## 2023-08-16 DIAGNOSIS — Z01810 Encounter for preprocedural cardiovascular examination: Secondary | ICD-10-CM | POA: Diagnosis not present

## 2023-08-16 DIAGNOSIS — R55 Syncope and collapse: Secondary | ICD-10-CM | POA: Insufficient documentation

## 2023-08-16 NOTE — Telephone Encounter (Signed)
Pt called in. Reports was cleared today by cardiology to have procedure done. Notes in epic.

## 2023-08-16 NOTE — Telephone Encounter (Signed)
Checking percert on the following patient for   2 week Zio

## 2023-08-16 NOTE — Progress Notes (Signed)
Cardiology Office Note  Date: 08/16/2023   ID: Corabell, Goldade [birth year], MRN [id number]  PCP:  Rebekah Chesterfield, NP  Cardiologist:  Marjo Bicker, MD Electrophysiologist:  None   History of Present Illness: Amy Santos is a 77 y.o. female known to have HTN, HLD was referred to cardiology clinic for preop cardiac risk stratification for colonoscopy due to history of syncope, dizziness and possible atrial fibrillation.  Patient reported having syncopal spells for the last 2 years. It occurs frequently, 3-4 times per week.  When she passes out, she feels like she is going to take a nap and sometimes she may not feel this way.  She has insomnia but does not feel sleepy during the daytime.  She does have a prodrome of dizziness sometimes no palpitations all the palpitations occur at different times.  No postictal confusion.  She also reports having dizziness especially from sitting to standing position or when she tries to rush.she says she always thinks faster all her life and does not want to slow down.  Palpitations last for a few minutes and occur few times a week.  I reviewed all the EKGs on the chart that showed normal sinus rhythm.  There was 1 EKG from 04/2023 that has baseline artifact but it showed a few QRS complexes that has a P wave preceding it.  Denies having any angina or DOE or orthopnea and PND.  No leg swelling.  Past Medical History:  Diagnosis Date   Anxiety    Arthritis    tingling down both legs, neuropathy     Chest pain    Chronic pain syndrome    Depression    Dyslipidemia    GERD (gastroesophageal reflux disease)    Head ache    Heart murmur    MVP   Hypertension    Migraines    Mitral valve prolapse    Neuromuscular disorder (HCC)    neuropathy feet & legs    Tobacco abuse     Past Surgical History:  Procedure Laterality Date   ABDOMINAL HYSTERECTOMY     bunyunectomy     CARPAL TUNNEL RELEASE Bilateral    CATARACT  EXTRACTION     EYE SURGERY Bilateral    hysterectomy---unknown     SHOULDER ARTHROSCOPY Right     Current Outpatient Medications  Medication Sig Dispense Refill   acetaminophen (TYLENOL) 500 MG tablet Take 500 mg daily as needed by mouth for moderate pain.     albuterol (PROVENTIL HFA;VENTOLIN HFA) 108 (90 Base) MCG/ACT inhaler Inhale 2 puffs every 6 (six) hours as needed into the lungs for wheezing or shortness of breath.     ALPRAZolam (XANAX) 1 MG tablet Take 1 mg 3 (three) times daily as needed by mouth for anxiety.      amLODipine (NORVASC) 5 MG tablet Take 5 mg daily before breakfast by mouth.      amoxicillin-clavulanate (AUGMENTIN) 875-125 MG tablet Take 1 tablet by mouth every 12 (twelve) hours. 14 tablet 0   aspirin 81 MG tablet Take 81 mg by mouth daily.     Calcium-Vitamin D 600-125 MG-UNIT TABS Take 2 tablets by mouth daily.     cholecalciferol (VITAMIN D) 1000 units tablet Take 1,000 Units daily by mouth.     gabapentin (NEURONTIN) 800 MG tablet Take 400 mg 3 (three) times daily by mouth.     loratadine (CLARITIN) 10 MG tablet Take 10 mg daily as needed by mouth.  Magnesium 400 MG CAPS Take 400 mg daily by mouth.      meclizine (ANTIVERT) 25 MG tablet Take 25 mg 3 (three) times daily as needed by mouth for dizziness.     Multiple Vitamin (MULTIVITAMIN WITH MINERALS) TABS tablet Take 1 tablet daily by mouth.     omeprazole (PRILOSEC) 20 MG capsule Take 1 capsule (20 mg total) by mouth daily. 90 capsule 3   ondansetron (ZOFRAN) 4 MG tablet Take 1 tablet (4 mg total) by mouth every 8 (eight) hours as needed for nausea or vomiting. 20 tablet 0   oxyCODONE-acetaminophen (PERCOCET) 10-325 MG tablet Take 1 tablet by mouth 4 (four) times daily as needed for pain.     rOPINIRole (REQUIP) 2 MG tablet Take 4 mg by mouth at bedtime.      sodium chloride (OCEAN) 0.65 % SOLN nasal spray Place 1 spray as needed into both nostrils for congestion.     No current facility-administered  medications for this visit.   Allergies:  Dextromethorphan, Dextromethorphan hbr, Doxylamine, Nyquil multi-symptom [pseudoeph-doxylamine-dm-apap], Pseudoephedrine hcl, Quetiapine, and Sulfacetamide   Social History: The patient  reports that she has been smoking cigarettes. She has never used smokeless tobacco. She reports that she does not drink alcohol and does not use drugs.   Family History: The patient's family history is not on file.   ROS:  Please see the history of present illness. Otherwise, complete review of systems is positive for none  All other systems are reviewed and negative.   Physical Exam: VS:  Ht 5\' 2"  (1.575 m)   Wt 114 lb (51.7 kg)   BMI 20.85 kg/m , BMI Body mass index is 20.85 kg/m.  Wt Readings from Last 3 Encounters:  08/16/23 114 lb (51.7 kg)  04/20/23 118 lb 12.8 oz (53.9 kg)  04/15/23 120 lb (54.4 kg)    General: Patient appears comfortable at rest. HEENT: Conjunctiva and lids normal, oropharynx clear with moist mucosa. Neck: Supple, no elevated JVP or carotid bruits, no thyromegaly. Lungs: Clear to auscultation, nonlabored breathing at rest. Cardiac: Regular rate and rhythm, no S3 or significant systolic murmur, no pericardial rub. Abdomen: Soft, nontender, no hepatomegaly, bowel sounds present, no guarding or rebound. Extremities: No pitting edema, distal pulses 2+. Skin: Warm and dry. Musculoskeletal: No kyphosis. Neuropsychiatric: Alert and oriented x3, affect grossly appropriate.  Recent Labwork: 04/15/2023: BUN 14; Creatinine, Ser 0.85; Hemoglobin 12.3; Platelets 210; Potassium 3.2; Sodium 138  No results found for: "CHOL", "TRIG", "HDL", "CHOLHDL", "VLDL", "LDLCALC", "LDLDIRECT"   Assessment and Plan:  Syncope, likely noncardiac: Episodes of syncope x 2 years and occurs frequently 3-4 times per week. She feels like taking a nap during the episode syncope.  Sometimes she did respond during this syncopal spell when her son asked her how she  was, according to her.  I reviewed all the EKGs in the EMR that showed normal sinus rhythm but there was 1 EKG from 5/24 that showed baseline artifact but there were QRS complexes with the preceding P wave.  I will obtain 2-week event monitor, live to rule out any atrial arrhythmias and conduction system issues.  Obtain echocardiogram to rule out any structural heart disease but EKG did not show any evidence of severe LVH.  No murmur on physical examination.  Dizziness: Dizziness occurs when she stands up quickly from sitting position and also when she tries to rush/move sideways. She always did things faster in her life and did not like the idea of slowing down  due to the old age.  Associated with unsteady gait.  Orthostatic vitals in the clinic were negative for orthostatic hypotension and POTS.  Instructed her to try walking with a cane and to take time to change positions.  Preop cardiac risk stratification for colonoscopy: EKG today in the clinic showed normal sinus rhythm, normal QTc interval and no evidence of preexcitation. I reviewed the other EKGs on the chart that showed normal sinus rhythm and no evidence of atrial fibrillation. She denies having angina, DOE, orthopnea or PND.  She does not have any active ACS, heart failure exacerbation or severe valvular heart disease (no murmur on physical examination). No contraindication to undergo any invasive procedure. Patient is at low risk for any perioperative cardiac complications for a low risk procedure like colonoscopy.  No further cardiac testing is indicated prior to proceeding with the planned procedure. The procedure does not have to be delayed for echo and event monitor to be resulted.  HTN, partially controlled: Continue amlodipine 5 mg once daily, follows with PCP.       Medication Adjustments/Labs and Tests Ordered: Current medicines are reviewed at length with the patient today.  Concerns regarding medicines are outlined above.     Disposition:  Follow up  6 weeks  Signed Dvora Buitron Verne Spurr, MD, 08/16/2023 10:08 AM    [facility] [address] [place], [place], [place] [address]

## 2023-08-16 NOTE — Progress Notes (Signed)
CLEARANCE NOTES HAVE BEEN FAXED TO REQUESTING OFFICE  

## 2023-08-16 NOTE — Patient Instructions (Signed)
Medication Instructions:  Your physician recommends that you continue on your current medications as directed. Please refer to the Current Medication list given to you today.   Labwork: None  Testing/Procedures: Your physician has requested that you have an echocardiogram. Echocardiography is a painless test that uses sound waves to create images of your heart. It provides your doctor with information about the size and shape of your heart and how well your heart's chambers and valves are working. This procedure takes approximately one hour. There are no restrictions for this procedure. Please do NOT wear cologne, perfume, aftershave, or lotions (deodorant is allowed). Please arrive 15 minutes prior to your appointment time.  Your physician has recommended that you wear a Zio monitor.   This monitor is a medical device that records the heart's electrical activity. Doctors most often use these monitors to diagnose arrhythmias. Arrhythmias are problems with the speed or rhythm of the heartbeat. The monitor is a small device applied to your chest. You can wear one while you do your normal daily activities. While wearing this monitor if you have any symptoms to push the button and record what you felt. Once you have worn this monitor for the period of time provider prescribed (for 14 days), you will return the monitor device in the postage paid box. Once it is returned they will download the data collected and provide [place] with a report which the provider will then review and we will call you with those results. Important tips:  Avoid showering during the first 48 hours of wearing the monitor. Avoid excessive sweating to help maximize wear time. Do not submerge the device, no hot tubs, and no swimming pools. Keep any lotions or oils away from the patch. After 48 hours you may shower with the patch on. Take brief showers with your back facing the shower head.  Do not remove patch once it has been placed  because that will interrupt data and decrease adhesive wear time. Push the button when you have any symptoms and write down what you were feeling. Once you have completed wearing your monitor, remove and place into box which has postage paid and place in your outgoing mailbox.  If for some reason you have misplaced your box then call our office and we can provide another box and/or mail it off for you.   Follow-Up: Your physician recommends that you schedule a follow-up appointment in: 6 weeks  Any Other Special Instructions Will Be Listed Below (If Applicable).  If you need a refill on your cardiac medications before your next appointment, please call your pharmacy.

## 2023-08-16 NOTE — Telephone Encounter (Signed)
Will schedule pt once get providers schedule for Oct.

## 2023-08-17 ENCOUNTER — Telehealth: Payer: Self-pay | Admitting: Internal Medicine

## 2023-08-17 ENCOUNTER — Ambulatory Visit: Payer: 59 | Admitting: Internal Medicine

## 2023-08-17 NOTE — Telephone Encounter (Signed)
You have she is an ASA 2/3. Is she a 2 or 3? Please advise. Thank you

## 2023-08-17 NOTE — Telephone Encounter (Signed)
Patient called to provide a fax number for [facility], [phone]. Clearance for upcoming colonoscopy needs tobe faxed to their office.

## 2023-08-17 NOTE — Telephone Encounter (Signed)
Faxed over surgical clearance office note to [phone].

## 2023-08-17 NOTE — Telephone Encounter (Signed)
noted 

## 2023-08-21 ENCOUNTER — Other Ambulatory Visit: Payer: Self-pay | Admitting: *Deleted

## 2023-08-21 ENCOUNTER — Encounter: Payer: Self-pay | Admitting: *Deleted

## 2023-08-21 MED ORDER — PEG 3350-KCL-NA BICARB-NACL 420 G PO SOLR: 4000.0000 mL | Freq: Once | ORAL | 0 refills | Status: AC

## 2023-08-22 ENCOUNTER — Telehealth: Payer: Self-pay | Admitting: *Deleted

## 2023-08-22 NOTE — Telephone Encounter (Signed)
LM with family member to have pt call back  Pre-op appointment 09/07/23, Thursday, arrive at 10:00 am

## 2023-08-23 NOTE — Telephone Encounter (Signed)
Pt informed of pre-op appt date and time.

## 2023-09-04 ENCOUNTER — Ambulatory Visit: Payer: 59 | Attending: Internal Medicine

## 2023-09-04 DIAGNOSIS — R42 Dizziness and giddiness: Secondary | ICD-10-CM | POA: Diagnosis not present

## 2023-09-04 DIAGNOSIS — R55 Syncope and collapse: Secondary | ICD-10-CM

## 2023-09-05 LAB — ECHOCARDIOGRAM COMPLETE
AR max vel: 2.71 cm2
AV Area VTI: 2.67 cm2
AV Area mean vel: 2.65 cm2
AV Mean grad: 3 mm[Hg]
AV Peak grad: 5.4 mm[Hg]
Ao pk vel: 1.16 m/s
Area-P 1/2: 2.08 cm2
Calc EF: 66.5 %
MV VTI: 2.01 cm2
S' Lateral: 2.5 cm
Single Plane A2C EF: 62.2 %
Single Plane A4C EF: 76.3 %

## 2023-09-06 NOTE — Patient Instructions (Signed)
   Your procedure is scheduled on: 09/11/2023  Report to [facility] Main Entrance at  8:15   AM.  Call this number if you have problems the morning of surgery: [phone]   Remember:              Follow Directions on the letter you received from Your Physician's office regarding the Bowel Prep              No Smoking the day of Procedure :   Take these medicines the morning of surgery with A SIP OF WATER: Amlodipine and Omeprazole  Xanax, Gabapentin, claritin, meclizine, and/or oxycodone if needed   Do not wear jewelry, make-up or nail polish.    Do not bring valuables to the hospital.  Contacts, dentures or bridgework may not be worn into surgery.  .   Patients discharged the day of surgery will not be allowed to drive home.     Colonoscopy, Adult, Care After This sheet gives you information about how to care for yourself after your procedure. Your health care provider may also give you more specific instructions. If you have problems or questions, contact your health care provider. What can I expect after the procedure? After the procedure, it is common to have: A small amount of blood in your stool for 24 hours after the procedure. Some gas. Mild abdominal cramping or bloating.  Follow these instructions at home: General instructions  For the first 24 hours after the procedure: Do not drive or use machinery. Do not sign important documents. Do not drink alcohol. Do your regular daily activities at a slower pace than normal. Eat soft, easy-to-digest foods. Rest often. Take over-the-counter or prescription medicines only as told by your health care provider. It is up to you to get the results of your procedure. Ask your health care provider, or the department performing the procedure, when your results will be ready. Relieving cramping and bloating Try walking around when you have cramps or feel bloated. Apply heat to your abdomen as told by your health care provider.  Use a heat source that your health care provider recommends, such as a moist heat pack or a heating pad. Place a towel between your skin and the heat source. Leave the heat on for 20-30 minutes. Remove the heat if your skin turns bright red. This is especially important if you are unable to feel pain, heat, or cold. You may have a greater risk of getting burned. Eating and drinking Drink enough fluid to keep your urine clear or pale yellow. Resume your normal diet as instructed by your health care provider. Avoid heavy or fried foods that are hard to digest. Avoid drinking alcohol for as long as instructed by your health care provider. Contact a health care provider if: You have blood in your stool 2-3 days after the procedure. Get help right away if: You have more than a small spotting of blood in your stool. You pass large blood clots in your stool. Your abdomen is swollen. You have nausea or vomiting. You have a fever. You have increasing abdominal pain that is not relieved with medicine. This information is not intended to replace advice given to you by your health care provider. Make sure you discuss any questions you have with your health care provider. Document Released: 07/05/2004 Document Revised: 08/15/2016 Document Reviewed: 02/02/2016 Elsevier Interactive Patient Education  [employer].

## 2023-09-07 ENCOUNTER — Encounter (HOSPITAL_COMMUNITY)
Admission: RE | Admit: 2023-09-07 | Discharge: 2023-09-07 | Disposition: A | Discharge status: Home / Self Care | Payer: 59 | Source: Ambulatory Visit | Attending: Internal Medicine | Admitting: Internal Medicine

## 2023-09-07 VITALS — BP 144/59 | HR 75 | Temp 97.8°F | Resp 18 | Ht 62.0 in | Wt 114.0 lb

## 2023-09-07 DIAGNOSIS — E876 Hypokalemia: Secondary | ICD-10-CM

## 2023-09-11 ENCOUNTER — Encounter (HOSPITAL_COMMUNITY)
Admission: RE | Disposition: A | Discharge status: Home / Self Care | Payer: Self-pay | Source: Home / Self Care | Attending: Internal Medicine

## 2023-09-11 ENCOUNTER — Ambulatory Visit (HOSPITAL_COMMUNITY): Payer: 59 | Admitting: Certified Registered"

## 2023-09-11 ENCOUNTER — Ambulatory Visit (HOSPITAL_COMMUNITY)
Admission: RE | Admit: 2023-09-11 | Discharge: 2023-09-11 | Disposition: A | Discharge status: Home / Self Care | Payer: 59 | Attending: Internal Medicine | Admitting: Internal Medicine

## 2023-09-11 ENCOUNTER — Telehealth: Payer: Self-pay | Admitting: Internal Medicine

## 2023-09-11 ENCOUNTER — Encounter (HOSPITAL_COMMUNITY): Payer: Self-pay

## 2023-09-11 DIAGNOSIS — Z1211 Encounter for screening for malignant neoplasm of colon: Secondary | ICD-10-CM

## 2023-09-11 DIAGNOSIS — F32A Depression, unspecified: Secondary | ICD-10-CM | POA: Insufficient documentation

## 2023-09-11 DIAGNOSIS — F17210 Nicotine dependence, cigarettes, uncomplicated: Secondary | ICD-10-CM | POA: Diagnosis not present

## 2023-09-11 DIAGNOSIS — F419 Anxiety disorder, unspecified: Secondary | ICD-10-CM | POA: Insufficient documentation

## 2023-09-11 DIAGNOSIS — K5730 Diverticulosis of large intestine without perforation or abscess without bleeding: Secondary | ICD-10-CM

## 2023-09-11 DIAGNOSIS — Z860101 Personal history of adenomatous and serrated colon polyps: Secondary | ICD-10-CM | POA: Diagnosis not present

## 2023-09-11 DIAGNOSIS — Z860100 Personal history of colon polyps, unspecified: Secondary | ICD-10-CM

## 2023-09-11 DIAGNOSIS — K219 Gastro-esophageal reflux disease without esophagitis: Secondary | ICD-10-CM | POA: Insufficient documentation

## 2023-09-11 DIAGNOSIS — I10 Essential (primary) hypertension: Secondary | ICD-10-CM | POA: Insufficient documentation

## 2023-09-11 DIAGNOSIS — K648 Other hemorrhoids: Secondary | ICD-10-CM

## 2023-09-11 DIAGNOSIS — Z09 Encounter for follow-up examination after completed treatment for conditions other than malignant neoplasm: Secondary | ICD-10-CM | POA: Diagnosis not present

## 2023-09-11 HISTORY — PX: COLONOSCOPY WITH PROPOFOL: SHX5780

## 2023-09-11 SURGERY — COLONOSCOPY WITH PROPOFOL
Anesthesia: General

## 2023-09-11 MED ORDER — PROPOFOL 1000 MG/100ML IV EMUL
INTRAVENOUS | Status: AC
  Filled 2023-09-11: qty 100

## 2023-09-11 MED ORDER — PROPOFOL 10 MG/ML IV BOLUS
INTRAVENOUS | Status: DC | PRN
  Administered 2023-09-11: 80 mg via INTRAVENOUS

## 2023-09-11 MED ORDER — PHENYLEPHRINE 80 MCG/ML (10ML) SYRINGE FOR IV PUSH (FOR BLOOD PRESSURE SUPPORT)
PREFILLED_SYRINGE | INTRAVENOUS | Status: AC
  Filled 2023-09-11: qty 10

## 2023-09-11 MED ORDER — LIDOCAINE HCL (PF) 2 % IJ SOLN
INTRAMUSCULAR | Status: AC
  Filled 2023-09-11: qty 5

## 2023-09-11 MED ORDER — PROPOFOL 500 MG/50ML IV EMUL
INTRAVENOUS | Status: DC | PRN
  Administered 2023-09-11: 150 ug/kg/min via INTRAVENOUS

## 2023-09-11 MED ORDER — LACTATED RINGERS IV SOLN: INTRAVENOUS | Status: DC

## 2023-09-11 MED ORDER — LIDOCAINE HCL (CARDIAC) PF 100 MG/5ML IV SOSY
PREFILLED_SYRINGE | INTRAVENOUS | Status: DC | PRN
  Administered 2023-09-11: 80 mg via INTRAVENOUS

## 2023-09-11 NOTE — H&P (Signed)
Primary Care Physician:  Rebekah Chesterfield, NP Primary Gastroenterologist:  Dr. Marletta Lor  Pre-Procedure History & Physical: HPI:  Amy Santos is a 77 y.o. female is here for a colonoscopy to be performed for surveillance purposes, personal history of adenomatous colon polyps  Past Medical History:  Diagnosis Date   Anxiety    Arthritis    tingling down both legs, neuropathy     Chest pain    Chronic pain syndrome    Depression    Dyslipidemia    GERD (gastroesophageal reflux disease)    Head ache    Heart murmur    MVP   Hypertension    Migraines    Mitral valve prolapse    Neuromuscular disorder (HCC)    neuropathy feet & legs    Tobacco abuse     Past Surgical History:  Procedure Laterality Date   ABDOMINAL HYSTERECTOMY     bunyunectomy     CARPAL TUNNEL RELEASE Bilateral    CATARACT EXTRACTION     EYE SURGERY Bilateral    hysterectomy---unknown     SHOULDER ARTHROSCOPY Right     Prior to Admission medications   Medication Sig Start Date End Date Taking? Authorizing Provider  albuterol (PROVENTIL HFA;VENTOLIN HFA) 108 (90 Base) MCG/ACT inhaler Inhale 2 puffs every 6 (six) hours as needed into the lungs for wheezing or shortness of breath.   Yes [provider]  ALPRAZolam Prudy Feeler) 1 MG tablet Take 1 mg by mouth 2 (two) times daily as needed for anxiety.   Yes [provider]  amLODipine (NORVASC) 5 MG tablet Take 5 mg daily before breakfast by mouth.    Yes [provider]  aspirin 81 MG tablet Take 81 mg by mouth daily.   Yes [provider]  Calcium-Vitamin D 600-125 MG-UNIT TABS Take 2 tablets by mouth daily.   Yes [provider]  cholecalciferol (VITAMIN D) 1000 units tablet Take 1,000 Units daily by mouth.   Yes [provider]  gabapentin (NEURONTIN) 800 MG tablet Take 800 mg by mouth 3 (three) times daily.   Yes [provider]  loratadine (CLARITIN) 10 MG tablet Take 10 mg daily as needed  by mouth.    Yes [provider]  Magnesium 400 MG CAPS Take 400 mg daily by mouth.    Yes [provider]  meclizine (ANTIVERT) 25 MG tablet Take 25 mg 3 (three) times daily as needed by mouth for dizziness.   Yes [provider]  Multiple Vitamin (MULTIVITAMIN WITH MINERALS) TABS tablet Take 1 tablet daily by mouth.   Yes [provider]  omeprazole (PRILOSEC) 20 MG capsule Take 1 capsule (20 mg total) by mouth daily. 04/28/16  Yes Anice Paganini, NP  ondansetron (ZOFRAN) 4 MG tablet Take 1 tablet (4 mg total) by mouth every 8 (eight) hours as needed for nausea or vomiting. 05/20/16  Yes Anice Paganini, NP  oxyCODONE-acetaminophen (PERCOCET) 10-325 MG tablet Take 1 tablet by mouth 4 (four) times daily as needed for pain.   Yes [provider]  rOPINIRole (REQUIP) 2 MG tablet Take 4 mg by mouth at bedtime.  05/29/15  Yes [provider]  sodium chloride (OCEAN) 0.65 % SOLN nasal spray Place 1 spray as needed into both nostrils for congestion.   Yes [provider]  acetaminophen (TYLENOL) 500 MG tablet Take 500 mg daily as needed by mouth for moderate pain.    [provider]    Allergies as  of 08/21/2023 - Review Complete 08/16/2023  Allergen Reaction Noted   Dextromethorphan  11/17/2020   Dextromethorphan hbr  11/17/2020   Doxylamine  11/17/2020   Nyquil multi-symptom [pseudoeph-doxylamine-dm-apap]  03/17/2016   Pseudoephedrine hcl  11/17/2020   Quetiapine  03/08/2021   Sulfacetamide  03/26/2021    Family History  Problem Relation Age of Onset   Colon cancer Neg Hx     Social History   Socioeconomic History   Marital status: Married    Spouse name: Not on file   Number of children: Not on file   Years of education: Not on file   Highest education level: Not on file  Occupational History   Occupation: retired  Tobacco Use   Smoking status: Every Day    Current packs/day: 0.50    Types: Cigarettes   Smokeless  tobacco: Never  Substance and Sexual Activity   Alcohol use: No    Alcohol/week: 0.0 standard drinks of alcohol   Drug use: No   Sexual activity: Not Currently  Other Topics Concern   Not on file  Social History Narrative   Not on file   Social Determinants of Health   Financial Resource Strain: Not on file  Food Insecurity: Not on file  Transportation Needs: Not on file  Physical Activity: Not on file  Stress: Not on file  Social Connections: Not on file  Intimate Partner Violence: Not on file    Review of Systems: See HPI, otherwise negative ROS  Physical Exam: Vital signs in last 24 hours: Temp:  [97.6 F (36.4 C)] 97.6 F (36.4 C) (10/07 0842) Pulse Rate:  [95] 95 (10/07 0842) Resp:  [20] 20 (10/07 0842) BP: (160)/(91) 160/91 (10/07 0842) SpO2:  [100 %] 100 % (10/07 0842)   General:   Alert,  Well-developed, well-nourished, pleasant and cooperative in NAD Head:  Normocephalic and atraumatic. Eyes:  Sclera clear, no icterus.   Conjunctiva pink. Ears:  Normal auditory acuity. Nose:  No deformity, discharge,  or lesions. Msk:  Symmetrical without gross deformities. Normal posture. Extremities:  Without clubbing or edema. Neurologic:  Alert and  oriented x4;  grossly normal neurologically. Skin:  Intact without significant lesions or rashes. Psych:  Alert and cooperative. Normal mood and affect.  Impression/Plan: Amy Santos is here for a colonoscopy to be performed for surveillance purposes, personal history of adenomatous colon polyps   The risks of the procedure including infection, bleed, or perforation as well as benefits, limitations, alternatives and imponderables have been reviewed with the patient. Questions have been answered. All parties agreeable.

## 2023-09-11 NOTE — Transfer of Care (Signed)
Immediate Anesthesia Transfer of Care Note  Patient: [name]  Procedure(s) Performed: COLONOSCOPY WITH PROPOFOL  Patient Location: Short Stay  Anesthesia Type:General  Level of Consciousness: awake and patient cooperative  Airway & Oxygen Therapy: Patient Spontanous Breathing and Patient connected to nasal cannula oxygen  Post-op Assessment: Report given to RN and Post -op Vital signs reviewed and stable  Post vital signs: Reviewed and stable  Last Vitals:  Vitals Value Taken Time  BP 121/57 09/11/23 1016  Temp 36.7 C 09/11/23 1016  Pulse 75 09/11/23 1016  Resp 17 09/11/23 1016  SpO2 100 % 09/11/23 1016    Last Pain:  Vitals:   09/11/23 1016  TempSrc: Axillary  PainSc: 0-No pain         Complications: No notable events documented.

## 2023-09-11 NOTE — Op Note (Signed)
[facility] Patient Name: Amy Santos Procedure Date: 09/11/2023 9:38 AM MRN: [id number] Date of Birth: [birth year] Attending MD: Hennie Duos. Marletta Lor , [place], [id number] CSN: [id number] Age: 77 Admit Type: Outpatient Procedure:                Colonoscopy Indications:              Surveillance: Personal history of colonic polyps                            (unknown histology) on last colonoscopy more than 5                            years ago Providers:                Hennie Duos. Marletta Lor, DO, Buel Ream. Museum/gallery exhibitions officer, RN,                            Francoise Ceo RN, RN, Lennice Sites Technician,                            Technician Referring MD:             Hennie Duos. Marletta Lor, DO Medicines:                See the Anesthesia note for documentation of the                            administered medications Complications:            No immediate complications. Estimated Blood Loss:     Estimated blood loss: none. Procedure:                Pre-Anesthesia Assessment:                           - The anesthesia plan was to use monitored                            anesthesia care (MAC).                           After obtaining informed consent, the colonoscope                            was passed under direct vision. Throughout the                            procedure, the patient's blood pressure, pulse, and                            oxygen saturations were monitored continuously. The                            [id number]-HQ190L ([id number]) scope was introduced through                            the anus and advanced to the the  cecum, identified                            by appendiceal orifice and ileocecal valve. The                            colonoscopy was technically difficult and complex                            due to multiple diverticula in the colon and                            restricted mobility of the colon. Successful                            completion of the procedure was aided  by                            straightening and shortening the scope to obtain                            bowel loop reduction. The patient tolerated the                            procedure well. The quality of the bowel                            preparation was evaluated using the BBPS [facility]                            Bowel Preparation Scale) with scores of: Right                            Colon = 2 (minor amount of residual staining, small                            fragments of stool and/or opaque liquid, but mucosa                            seen well), Transverse Colon = 2 (minor amount of                            residual staining, small fragments of stool and/or                            opaque liquid, but mucosa seen well) and Left Colon                            = 2 (minor amount of residual staining, small                            fragments of stool and/or opaque liquid, but mucosa  seen well). The total BBPS score equals 6. Fair. Scope In: 9:54:58 AM Scope Out: 10:11:53 AM Scope Withdrawal Time: 0 hours 9 minutes 36 seconds  Total Procedure Duration: 0 hours 16 minutes 55 seconds  Findings:      Non-bleeding internal hemorrhoids were found during endoscopy.      Multiple large-mouthed and small-mouthed diverticula were found in the       sigmoid colon, resulting in an angulated colon.      The exam was otherwise without abnormality. Impression:               - Non-bleeding internal hemorrhoids.                           - Diverticulosis in the sigmoid colon.                           - The examination was otherwise normal.                           - No specimens collected. Moderate Sedation:      Per Anesthesia Care Recommendation:           - Patient has a contact number available for                            emergencies. The signs and symptoms of potential                            delayed complications were discussed with the                             patient. Return to normal activities tomorrow.                            Written discharge instructions were provided to the                            patient.                           - Resume previous diet.                           - Continue present medications.                           - No repeat colonoscopy due to age.                           - Return to GI clinic in 3 months. Procedure Code(s):        --- Professional ---                           Z6109, Colorectal cancer screening; colonoscopy on                            individual at high risk Diagnosis Code(s):        ---  Professional ---                           Z86.010, Personal history of colonic polyps                           K64.8, Other hemorrhoids                           K57.30, Diverticulosis of large intestine without                            perforation or abscess without bleeding CPT copyright 2022 American Medical Association. All rights reserved. The codes documented in this report are preliminary and upon coder review may  be revised to meet current compliance requirements. Hennie Duos. Marletta Lor, DO Hennie Duos. Charle Mclaurin, DO 09/11/2023 10:16:02 AM This report has been signed electronically. Number of Addenda: 0

## 2023-09-11 NOTE — Anesthesia Preprocedure Evaluation (Signed)
Anesthesia Evaluation  Patient identified by MRN, date of birth, ID band Patient awake    Reviewed: Allergy & Precautions, H&P , NPO status , Patient's Chart, lab work & pertinent test results, reviewed documented beta blocker date and time   Airway Mallampati: II  TM Distance: >3 FB Neck ROM: full    Dental no notable dental hx.    Pulmonary neg pulmonary ROS, Current Smoker and Patient abstained from smoking.   Pulmonary exam normal breath sounds clear to auscultation       Cardiovascular Exercise Tolerance: Good hypertension, negative cardio ROS + Valvular Problems/Murmurs  Rhythm:regular Rate:Normal     Neuro/Psych  Headaches PSYCHIATRIC DISORDERS Anxiety Depression     Neuromuscular disease negative neurological ROS  negative psych ROS   GI/Hepatic negative GI ROS, Neg liver ROS,GERD  ,,  Endo/Other  negative endocrine ROS    Renal/GU negative Renal ROS  negative genitourinary   Musculoskeletal   Abdominal   Peds  Hematology negative hematology ROS (+)   Anesthesia Other Findings   Reproductive/Obstetrics negative OB ROS                             Anesthesia Physical Anesthesia Plan  ASA: 2  Anesthesia Plan: General   Post-op Pain Management:    Induction:   PONV Risk Score and Plan: Propofol infusion  Airway Management Planned:   Additional Equipment:   Intra-op Plan:   Post-operative Plan:   Informed Consent: I have reviewed the patients History and Physical, chart, labs and discussed the procedure including the risks, benefits and alternatives for the proposed anesthesia with the patient or authorized representative who has indicated his/her understanding and acceptance.     Dental Advisory Given  Plan Discussed with: CRNA  Anesthesia Plan Comments:        Anesthesia Quick Evaluation

## 2023-09-11 NOTE — Telephone Encounter (Signed)
Patient informed and verbalized understanding of plan. 

## 2023-09-11 NOTE — Discharge Instructions (Addendum)
  Colonoscopy Discharge Instructions  Read the instructions outlined below and refer to this sheet in the next few weeks. These discharge instructions provide you with general information on caring for yourself after you leave the hospital. Your doctor may also give you specific instructions. While your treatment has been planned according to the most current medical practices available, unavoidable complications occasionally occur.   ACTIVITY You may resume your regular activity, but move at a slower pace for the next 24 hours.  Take frequent rest periods for the next 24 hours.  Walking will help get rid of the air and reduce the bloated feeling in your belly (abdomen).  No driving for 24 hours (because of the medicine (anesthesia) used during the test).   Do not sign any important legal documents or operate any machinery for 24 hours (because of the anesthesia used during the test).  NUTRITION Drink plenty of fluids.  You may resume your normal diet as instructed by your doctor.  Begin with a light meal and progress to your normal diet. Heavy or fried foods are harder to digest and may make you feel sick to your stomach (nauseated).  Avoid alcoholic beverages for 24 hours or as instructed.  MEDICATIONS You may resume your normal medications unless your doctor tells you otherwise.  WHAT YOU CAN EXPECT TODAY Some feelings of bloating in the abdomen.  Passage of more gas than usual.  Spotting of blood in your stool or on the toilet paper.  IF YOU HAD POLYPS REMOVED DURING THE COLONOSCOPY: No aspirin products for 7 days or as instructed.  No alcohol for 7 days or as instructed.  Eat a soft diet for the next 24 hours.  FINDING OUT THE RESULTS OF YOUR TEST Not all test results are available during your visit. If your test results are not back during the visit, make an appointment with your caregiver to find out the results. Do not assume everything is normal if you have not heard from your  caregiver or the medical facility. It is important for you to follow up on all of your test results.  SEEK IMMEDIATE MEDICAL ATTENTION IF: You have more than a spotting of blood in your stool.  Your belly is swollen (abdominal distention).  You are nauseated or vomiting.  You have a temperature over 101.  You have abdominal pain or discomfort that is severe or gets worse throughout the day.   Your colonoscopy was relatively unremarkable.  I did not find any polyps or evidence of colon cancer.    You do have diverticulosis and internal hemorrhoids. I would recommend increasing fiber in your diet or adding OTC Benefiber/Metamucil. Be sure to drink at least 4 to 6 glasses of water daily. Follow-up with GI 2-3 months   I hope you have a great rest of your week!  Hennie Duos. Marletta Lor, D.O. Gastroenterology and Hepatology [facility]

## 2023-09-11 NOTE — Telephone Encounter (Signed)
° °  Pt is returning call to get echo result °

## 2023-09-11 NOTE — Anesthesia Postprocedure Evaluation (Signed)
Anesthesia Post Note  Patient: Amy Santos  Procedure(s) Performed: COLONOSCOPY WITH PROPOFOL  Patient location during evaluation: Phase II Anesthesia Type: General Level of consciousness: awake Pain management: pain level controlled Vital Signs Assessment: post-procedure vital signs reviewed and stable Respiratory status: spontaneous breathing and respiratory function stable Cardiovascular status: blood pressure returned to baseline and stable Postop Assessment: no headache and no apparent nausea or vomiting Anesthetic complications: no Comments: Late entry   No notable events documented.   Last Vitals:  Vitals:   09/11/23 0842 09/11/23 1016  BP: (!) 160/91 (!) 121/57  Pulse: 95 75  Resp: 20 17  Temp: 36.4 C 36.7 C  SpO2: 100% 100%    Last Pain:  Vitals:   09/11/23 1016  TempSrc: Axillary  PainSc: 0-No pain                 Windell Norfolk

## 2023-09-19 ENCOUNTER — Encounter (HOSPITAL_COMMUNITY): Payer: Self-pay | Admitting: Internal Medicine

## 2023-09-26 ENCOUNTER — Encounter: Payer: Self-pay | Admitting: Internal Medicine

## 2023-09-26 ENCOUNTER — Telehealth: Payer: Self-pay

## 2023-09-26 ENCOUNTER — Ambulatory Visit: Payer: 59 | Attending: Internal Medicine | Admitting: Internal Medicine

## 2023-09-26 VITALS — BP 126/74 | HR 69 | Ht 61.0 in | Wt 116.0 lb

## 2023-09-26 DIAGNOSIS — I4719 Other supraventricular tachycardia: Secondary | ICD-10-CM | POA: Diagnosis not present

## 2023-09-26 MED ORDER — DILTIAZEM HCL 60 MG PO TABS: 60.0000 mg | ORAL_TABLET | Freq: Three times a day (TID) | ORAL | 2 refills | Status: DC

## 2023-09-26 NOTE — Telephone Encounter (Signed)
Per Dr. Jenene Slicker updated after patient had left: We started diltiazem today, instruct patient to hold off on amlodipine as both diltiazem and amlodipine are similar medications from the same class.  Patient verbalized understanding

## 2023-09-26 NOTE — Progress Notes (Signed)
Cardiology Office Note  Date: 09/26/2023   ID: Katiya, Crompton 09-03-1946, MRN [id number]  PCP:  Rebekah Chesterfield, NP  Cardiologist:  Marjo Bicker, MD Electrophysiologist:  None   History of Present Illness: Amy Santos is a 77 y.o. female known to have HTN, HLD is here for follow-up visit.  Event monitor showed atrial tachycardia, 1.5% PAC burden, symptomatic from PAC and NSR.  Continues to have palpitations randomly, few times per week.  Syncope probably noncardiac in origin as the event monitor showed no major arrhythmias or conduction system abnormalities explaining the symptom of syncope.  Continues to have positional dizziness (from sitting to standing position).  No angina, DOE, orthopnea, PND, leg swelling.  Past Medical History:  Diagnosis Date   Anxiety    Arthritis    tingling down both legs, neuropathy     Chest pain    Chronic pain syndrome    Depression    Dyslipidemia    GERD (gastroesophageal reflux disease)    Head ache    Heart murmur    MVP   Hypertension    Migraines    Mitral valve prolapse    Neuromuscular disorder (HCC)    neuropathy feet & legs    Tobacco abuse     Past Surgical History:  Procedure Laterality Date   ABDOMINAL HYSTERECTOMY     bunyunectomy     CARPAL TUNNEL RELEASE Bilateral    CATARACT EXTRACTION     COLONOSCOPY WITH PROPOFOL N/A 09/11/2023   Procedure: COLONOSCOPY WITH PROPOFOL;  Surgeon: Lanelle Bal, DO;  [facility];  Service: Endoscopy;  Laterality: N/A;  10:15 am, asa 3   EYE SURGERY Bilateral    hysterectomy---unknown     SHOULDER ARTHROSCOPY Right     Current Outpatient Medications  Medication Sig Dispense Refill   acetaminophen (TYLENOL) 500 MG tablet Take 500 mg daily as needed by mouth for moderate pain.     albuterol (PROVENTIL HFA;VENTOLIN HFA) 108 (90 Base) MCG/ACT inhaler Inhale 2 puffs every 6 (six) hours as needed into the lungs for wheezing or shortness of  breath.     ALPRAZolam (XANAX) 1 MG tablet Take 1 mg by mouth 2 (two) times daily as needed for anxiety.     amLODipine (NORVASC) 5 MG tablet Take 5 mg daily before breakfast by mouth.      aspirin 81 MG tablet Take 81 mg by mouth daily.     Calcium-Vitamin D 600-125 MG-UNIT TABS Take 2 tablets by mouth daily.     cetirizine (ZYRTEC) 10 MG tablet Take by mouth as needed.     diltiazem (CARDIZEM) 60 MG tablet Take 1 tablet (60 mg total) by mouth every 8 (eight) hours. 60 tablet 2   gabapentin (NEURONTIN) 800 MG tablet Take 800 mg by mouth 3 (three) times daily.     loratadine (CLARITIN) 10 MG tablet Take 10 mg daily as needed by mouth.      Magnesium 400 MG CAPS Take 400 mg daily by mouth.      meclizine (ANTIVERT) 25 MG tablet Take 25 mg 3 (three) times daily as needed by mouth for dizziness.     mometasone (NASONEX) 50 MCG/ACT nasal spray as needed.     Multiple Vitamin (MULTIVITAMIN WITH MINERALS) TABS tablet Take 1 tablet daily by mouth.     omeprazole (PRILOSEC) 20 MG capsule Take 1 capsule (20 mg total) by mouth daily. 90 capsule 3   ondansetron (ZOFRAN) 4  MG tablet Take 1 tablet (4 mg total) by mouth every 8 (eight) hours as needed for nausea or vomiting. 20 tablet 0   oxyCODONE-acetaminophen (PERCOCET) 10-325 MG tablet Take 1 tablet by mouth 4 (four) times daily as needed for pain.     rOPINIRole (REQUIP) 2 MG tablet Take 4 mg by mouth at bedtime.      rosuvastatin (CRESTOR) 10 MG tablet Take 10 mg by mouth at bedtime.     sodium chloride (OCEAN) 0.65 % SOLN nasal spray Place 1 spray as needed into both nostrils for congestion.     SODIUM FLUORIDE 5000 SENSITIVE 1.1-5 % GEL Take by mouth.     No current facility-administered medications for this visit.   Allergies:  Dextromethorphan, Dextromethorphan hbr, Doxylamine, Nyquil multi-symptom [pseudoeph-doxylamine-dm-apap], Pseudoephedrine hcl, Quetiapine, and Sulfacetamide   Social History: The patient  reports that she has been smoking  cigarettes. She has never used smokeless tobacco. She reports that she does not drink alcohol and does not use drugs.   Family History: The patient's family history is not on file.   ROS:  Please see the history of present illness. Otherwise, complete review of systems is positive for none  All other systems are reviewed and negative.   Physical Exam: VS:  BP 126/74   Pulse 69   Ht 5\' 1"  (1.549 m)   Wt 116 lb (52.6 kg)   SpO2 97%   BMI 21.92 kg/m , BMI Body mass index is 21.92 kg/m.  Wt Readings from Last 3 Encounters:  09/26/23 116 lb (52.6 kg)  09/07/23 113 lb 15.7 oz (51.7 kg)  08/16/23 114 lb (51.7 kg)    General: Patient appears comfortable at rest. HEENT: Conjunctiva and lids normal, oropharynx clear with moist mucosa. Neck: Supple, no elevated JVP or carotid bruits, no thyromegaly. Lungs: Clear to auscultation, nonlabored breathing at rest. Cardiac: Regular rate and rhythm, no S3 or significant systolic murmur, no pericardial rub. Abdomen: Soft, nontender, no hepatomegaly, bowel sounds present, no guarding or rebound. Extremities: No pitting edema, distal pulses 2+. Skin: Warm and dry. Musculoskeletal: No kyphosis. Neuropsychiatric: Alert and oriented x3, affect grossly appropriate.  Recent Labwork: 04/15/2023: BUN 14; Creatinine, Ser 0.85; Hemoglobin 12.3; Platelets 210; Potassium 3.2; Sodium 138  No results found for: "CHOL", "TRIG", "HDL", "CHOLHDL", "VLDL", "LDLCALC", "LDLDIRECT"   Assessment and Plan:  Syncope, likely noncardiac: Episodes of syncope x 2 years and occurs frequently 3-4 times per week. She feels like taking a nap during the episode syncope.  Sometimes she did respond during this syncopal spell when her son asked her how she was, according to her.  No EKG evidence of arrhythmias. Event monitor showed 1.5% PAC burden and atrial tachycardia, symptomatic with PAC and NSR.  Recent syncopal spells are noncardiac in origin. No further testing is indicated at  this time.  Atrial tachycardia and 1.5% PAC burden: Event monitor from 09/2023 showed no evidence of atrial fibrillation however 27 runs of SVT were noted likely atrial tachycardia, fastest interval lasting 12.7 seconds and longest interval lasting 3 minutes 18 seconds. 1.5% PAC burden, symptomatic with PAC and NSR.  She continues to have palpitations. Will start diltiazem 60 mg every 8 hours.  Positional dizziness: Orthostatics in the prior clinic visit were negative for orthostatic hypotension and POTS, instructed patient to stand up slowly from sitting to standing position.   HTN, partially controlled: Will hold off on amlodipine as we started diltiazem today.   I have spent a total duration of 30  minutes reviewing the medications, EKG, event monitor results, prior notes, face-to-face discussion/counseling of her medical condition, pathophysiology, evaluation, management, reviewed medications, order new medications, and documenting the findings in the note.   Medication Adjustments/Labs and Tests Ordered: Current medicines are reviewed at length with the patient today.  Concerns regarding medicines are outlined above.    Disposition:  Follow up  3 months  Signed Trinh Sanjose Verne Spurr, MD, 09/26/2023 12:07 PM    [facility] [address] [place], [place], [place] [address]

## 2023-09-26 NOTE — Patient Instructions (Addendum)
Medication Instructions:  Your physician has recommended you make the following change in your medication:  Start taking Diltiazem 60 mg every 8 hours Continue taking all other medications as prescribed   Labwork: None  Testing/Procedures: None  Follow-Up: Your physician recommends that you schedule a follow-up appointment in: 3 months  Any Other Special Instructions Will Be Listed Below (If Applicable).  Thank you for choosing [facility]!      If you need a refill on your cardiac medications before your next appointment, please call your pharmacy.

## 2023-11-13 ENCOUNTER — Telehealth: Payer: Self-pay | Admitting: Family Medicine

## 2023-11-13 NOTE — Telephone Encounter (Signed)
Sadly, I cannot take new patients right now

## 2023-11-13 NOTE — Telephone Encounter (Signed)
Copied from CRM [id number]. Topic: Appointments - Scheduling Inquiry for Clinic >> Nov 13, 2023  8:34 AM Conni Elliot wrote: Reason for CRM: pt would like to inquire about scheduling with DO [name], pts husband Amy Santos) used to be a pt, callback number is [phone]

## 2023-11-13 NOTE — Telephone Encounter (Signed)
Patient aware via voicemail 

## 2023-11-13 NOTE — Telephone Encounter (Signed)
Will DR Nadine Counts approve?

## 2023-11-23 ENCOUNTER — Encounter: Payer: Self-pay | Admitting: Internal Medicine

## 2023-12-06 ENCOUNTER — Other Ambulatory Visit: Payer: Self-pay | Admitting: Internal Medicine

## 2024-01-01 NOTE — Progress Notes (Unsigned)
GI Office Note    Referring Provider: Rebekah Chesterfield, NP Primary Care Physician:  Rebekah Chesterfield, NP Primary Gastroenterologist: Hennie Duos. Marletta Lor, DO  Date:  01/02/2024  ID:  Amy Santos, Amy Santos 1946-10-27, MRN [id number]   Chief Complaint   Chief Complaint  Patient presents with   Follow-up    Follow up. Still having problems with stomach. After she eats has to go straight to the bathroom.    History of Present Illness  Amy Santos is a 78 y.o. female with a history of GERD, constipation, anxiety, depression, dyslipidemia, HTN, neuropathy, chronic pain presenting today for follow-up post colonoscopy.  ED visit 04/15/23 for weakness, nausea, and loose stools. Reported recurrent near syncope. Discharged on augmentin and zofran. Labs with mild leukocytosis WBC 12.2, normal Hgb. K 3.2.    CT A/P 04/15/23: -mild wall thickening of sigmoid colon with minimal fat stranding. -diverticulosis  Last office visit 04/20/23.  Patient reported symptoms leading up to her ED visit.  She reported her recurrent emesis and near syncope.  She reported a history of syncope years prior.  Does have frequent vertigo for which she takes meclizine nightly.  Has been following with PCP, had not seen cardiology.  Omeprazole working well for her reflux.  Also noted some mild constipation. Advised patient to finish course of Augmentin.  Continue omeprazole 20 mg once daily.  Scheduled for colonoscopy with Dr. Marletta Lor.  Given cardiology referral to heart care, recommended scheduling the colonoscopy after clearance from cardiology.  Advised to use MiraLAX or stool softener for constipation.  Colonoscopy 09/11/2023: -Nonbleeding internal hemorrhoids -Multiple large mouth and small mouth diverticula in sigmoid colon (angulated as a result) -No repeat colonoscopy recommended due to age  Today:  For about 2 months she goes to the bathroom soon after she eats. She reports bloating and does not want  to eat because of this. About 5-10 minutes after meals she goes to the bathroom. This morning when she woke up she had diarrhea - did not eat dinner last night. Stools are mushy - not watery. When she gets constipated she has to take something to make herself go. She has been having alternating constipation and diarrhea. Has a lot of urgency. Has gurgling of her stomach pretty often. She states that anything she eats it will make her go. Denis recent sick contacts.   Has not come off of pain medication - had to flu so she was unable to get refill.  Had the flu in December (week of 12/22). Has been off  pain medication for about a month. She has not been taking miralax. She will take imodium if she has diarrhea and if she gets constipated she will take miralax sometimes but other times she may take dulcolax to help her go.   She has been having lots of flatulence.   Omeprazole once daily - No N/V. Not able to swallow bread, able to eat other things just fine. No issues with medications.   Still having some syncopal episodes - per review of cardiology notes they feel as though this is non cardiac and could be intermittently orthostatic. Sleep seems to affect when these occur. Reports around thanksgiving she passed out on the porch and hit the side of her face on the concrete patio. States she had a CT scan. Cardiac workup negative. Repots some burning with urination that started this morning.   Wt Readings from Last 3 Encounters:  01/02/24 110 lb 12.8 oz (50.3 kg)  09/26/23 116 lb (52.6 kg)  09/07/23 113 lb 15.7 oz (51.7 kg)    Current Outpatient Medications  Medication Sig Dispense Refill   acetaminophen (TYLENOL) 500 MG tablet Take 500 mg daily as needed by mouth for moderate pain.     albuterol (PROVENTIL HFA;VENTOLIN HFA) 108 (90 Base) MCG/ACT inhaler Inhale 2 puffs every 6 (six) hours as needed into the lungs for wheezing or shortness of breath.     ALPRAZolam (XANAX) 1 MG tablet Take 1 mg by  mouth 2 (two) times daily as needed for anxiety.     amLODipine (NORVASC) 5 MG tablet Take 5 mg daily before breakfast by mouth.      aspirin 81 MG tablet Take 81 mg by mouth daily.     Calcium-Vitamin D 600-125 MG-UNIT TABS Take 2 tablets by mouth daily.     cetirizine (ZYRTEC) 10 MG tablet Take by mouth as needed.     diltiazem (CARDIZEM) 60 MG tablet TAKE ONE TABLET BY MOUTH EVERY EIGHT HOURS 60 tablet 2   gabapentin (NEURONTIN) 800 MG tablet Take 800 mg by mouth 3 (three) times daily.     loratadine (CLARITIN) 10 MG tablet Take 10 mg daily as needed by mouth.      Magnesium 400 MG CAPS Take 400 mg daily by mouth.      meclizine (ANTIVERT) 25 MG tablet Take 25 mg 3 (three) times daily as needed by mouth for dizziness.     mometasone (NASONEX) 50 MCG/ACT nasal spray as needed.     Multiple Vitamin (MULTIVITAMIN WITH MINERALS) TABS tablet Take 1 tablet daily by mouth.     omeprazole (PRILOSEC) 20 MG capsule Take 1 capsule (20 mg total) by mouth daily. 90 capsule 3   ondansetron (ZOFRAN) 4 MG tablet Take 1 tablet (4 mg total) by mouth every 8 (eight) hours as needed for nausea or vomiting. 20 tablet 0   rOPINIRole (REQUIP) 2 MG tablet Take 4 mg by mouth at bedtime.      rosuvastatin (CRESTOR) 10 MG tablet Take 10 mg by mouth at bedtime.     sodium chloride (OCEAN) 0.65 % SOLN nasal spray Place 1 spray as needed into both nostrils for congestion.     SODIUM FLUORIDE 5000 SENSITIVE 1.1-5 % GEL Take by mouth.     oxyCODONE-acetaminophen (PERCOCET) 10-325 MG tablet Take 1 tablet by mouth 4 (four) times daily as needed for pain. (Patient not taking: Reported on 01/02/2024)     No current facility-administered medications for this visit.    Past Medical History:  Diagnosis Date   Anxiety    Arthritis    tingling down both legs, neuropathy     Chest pain    Chronic pain syndrome    Depression    Dyslipidemia    GERD (gastroesophageal reflux disease)    Head ache    Heart murmur    MVP    Hypertension    Migraines    Mitral valve prolapse    Neuromuscular disorder (HCC)    neuropathy feet & legs    Tobacco abuse     Past Surgical History:  Procedure Laterality Date   ABDOMINAL HYSTERECTOMY     bunyunectomy     CARPAL TUNNEL RELEASE Bilateral    CATARACT EXTRACTION     COLONOSCOPY WITH PROPOFOL N/A 09/11/2023   Procedure: COLONOSCOPY WITH PROPOFOL;  Surgeon: Lanelle Bal, DO;  [facility];  Service: Endoscopy;  Laterality: N/A;  10:15 am, asa 3  EYE SURGERY Bilateral    hysterectomy---unknown     SHOULDER ARTHROSCOPY Right     Family History  Problem Relation Age of Onset   Colon cancer Neg Hx     Allergies as of 01/02/2024 - Review Complete 01/02/2024  Allergen Reaction Noted   Dextromethorphan  11/17/2020   Dextromethorphan hbr  11/17/2020   Doxylamine  11/17/2020   Nyquil multi-symptom [pseudoeph-doxylamine-dm-apap]  03/17/2016   Pseudoephedrine hcl  11/17/2020   Quetiapine  03/08/2021   Sulfacetamide  03/26/2021    Social History   Socioeconomic History   Marital status: Married    Spouse name: Not on file   Number of children: Not on file   Years of education: Not on file   Highest education level: Not on file  Occupational History   Occupation: retired  Tobacco Use   Smoking status: Every Day    Current packs/day: 0.50    Types: Cigarettes   Smokeless tobacco: Never  Substance and Sexual Activity   Alcohol use: No    Alcohol/week: 0.0 standard drinks of alcohol   Drug use: No   Sexual activity: Not Currently  Other Topics Concern   Not on file  Social History Narrative   Not on file   Social Drivers of Health   Financial Resource Strain: Not on file  Food Insecurity: Not on file  Transportation Needs: Not on file  Physical Activity: Not on file  Stress: Not on file  Social Connections: Not on file   Review of Systems   Gen: Denies fever, chills, anorexia. Denies fatigue, weakness, weight loss.  CV: Denies  chest pain, palpitations, syncope, peripheral edema, and claudication. Resp: Denies dyspnea at rest, cough, wheezing, coughing up blood, and pleurisy. GI: See HPI Derm: Denies rash, itching, dry skin Psych: Denies depression, anxiety, memory loss, confusion. No homicidal or suicidal ideation.  Heme: Denies bruising, bleeding, and enlarged lymph nodes.  Physical Exam   BP 137/85 (BP Location: Right Arm, Patient Position: Sitting, Cuff Size: Normal)   Pulse 79   Temp 98.2 F (36.8 C) (Temporal)   Ht 5\' 1"  (1.549 m)   Wt 110 lb 12.8 oz (50.3 kg)   BMI 20.94 kg/m   General:   Alert and oriented. No distress noted. Pleasant and cooperative.  Head:  Normocephalic and atraumatic. Eyes:  Conjuctiva clear without scleral icterus. Mouth:  Oral mucosa pink and moist. Good dentition. No lesions. Abdomen:  +BS, soft, non-tender and non-distended. No rebound or guarding. No HSM or masses noted. Rectal: deferred Msk:  Symmetrical without gross deformities. Normal posture. Extremities:  Without edema. Neurologic:  Alert and  oriented x4 Psych:  Alert and cooperative. Normal mood and affect.  Assessment  Amy Santos is a 78 y.o. female with a history of GERD, constipation, anxiety, depression, dyslipidemia, HTN, neuropathy, chronic pain presenting today for follow-up post colonoscopy.  History of colitis and colon polyps: CT in May with mild wall thickening of the sigmoid colon with minimal fat stranding and diverticulosis without diverticulitis.  She denied any recent exposure to sick contacts and none recently either.  She was treated with a course of Augmentin.  She underwent colonoscopy in October with evidence of diverticulosis, nonbleeding internal hemorrhoids and otherwise normal colonic mucosa.  Given her age no repeat is recommended.  GERD: Well-controlled with omeprazole 20 mg once daily.  Has reported some occasional dysphagia related to bread but this is not often.  Denies  issues with medications or liquids.  If symptoms were  to worsen we could offer EGD however recommended continuing with low-dose PPI for now.  Constipation, Fecal urgency, intermittent diarrhea: Usually has baseline chronic constipation for many years and has been exacerbated by chronic narcotic use.  Currently having symptoms of fecal urgency and what appears to be overflow diarrhea as well as bloating.  She is having a fullness feeling and therefore not wanting to eat much given fear of what her bowel habits might be like or what discomfort she may have after eating.  No significant weight loss identified per review of her chart.  Will obtain KUB to confirm constipation.  Provided samples of Linzess to help with constipation and bloating given she will resume narcotics here soon.  Also discussed dietary recommendations and provided handout.  Can consider further workup of bloating in the future if needed.  If she tends to have more diarrhea versus constipation then we can work this up further as well with stool testing and other lab work including celiac, alpha gal, fecal elastase.  PLAN   KUB Linzess 145 mcg once daily, samples provided as well as prescription. Call with progress report once done with samples.  Discussed washout period.  Continue omeprazole 20 mg once daily Provided constipation diet handout Follow up 6 weeks   Brooke Bonito, MSN, FNP-BC, AGACNP-BC [facility]

## 2024-01-02 ENCOUNTER — Ambulatory Visit: Payer: 59 | Admitting: Internal Medicine

## 2024-01-02 ENCOUNTER — Encounter: Payer: Self-pay | Admitting: Gastroenterology

## 2024-01-02 ENCOUNTER — Ambulatory Visit (INDEPENDENT_AMBULATORY_CARE_PROVIDER_SITE_OTHER): Payer: 59 | Admitting: Gastroenterology

## 2024-01-02 VITALS — BP 137/85 | HR 79 | Temp 98.2°F | Ht 61.0 in | Wt 110.8 lb

## 2024-01-02 DIAGNOSIS — Z8719 Personal history of other diseases of the digestive system: Secondary | ICD-10-CM

## 2024-01-02 DIAGNOSIS — K5900 Constipation, unspecified: Secondary | ICD-10-CM

## 2024-01-02 DIAGNOSIS — R152 Fecal urgency: Secondary | ICD-10-CM

## 2024-01-02 DIAGNOSIS — K219 Gastro-esophageal reflux disease without esophagitis: Secondary | ICD-10-CM | POA: Diagnosis not present

## 2024-01-02 DIAGNOSIS — R143 Flatulence: Secondary | ICD-10-CM

## 2024-01-02 DIAGNOSIS — R197 Diarrhea, unspecified: Secondary | ICD-10-CM

## 2024-01-02 DIAGNOSIS — Z860100 Personal history of colon polyps, unspecified: Secondary | ICD-10-CM

## 2024-01-02 MED ORDER — LINACLOTIDE 145 MCG PO CAPS: 145.0000 ug | ORAL_CAPSULE | Freq: Every day | ORAL | 1 refills | Status: DC

## 2024-01-02 NOTE — Patient Instructions (Addendum)
How to take Linzess: Once a day every day on empty stomach, at least 30 minutes before your first meal of the day. It is best to keep medications at a stable temperature Medication is best kept in its original bottle with the disket present.  It is a medication that is meant for everyday use and not to be used as needed.   What to expect: Constipation relief is typically felt in about 1 week Relief of abdominal pain, discomfort, and bloating begins in about 1 week with symptoms typically improving over 12 weeks and beyond. Diarrhea is most common side effect and typically begins within the first 2 weeks and can take 3-4 weeks to resolve It would be helpful to begin treatment over the weekend or when you can be closer to a bathroom   You can go to [email or web address] for patient support and sign up for daily medication reminders.   Please call let me know how you are doing and 1-2 weeks.  I will go ahead and send in prescription for you to the drugstore in [place].  On the back of your paperwork today is education about constipation and good diet recommendations.  Continue omeprazole 20 mg once daily.  I have ordered an abdominal x-ray for you.  You do not need an appointment, you can walk-in and have this done at your convenience.    It was a pleasure to see you today. I want to create trusting relationships with patients. If you receive a survey regarding your visit,  I greatly appreciate you taking time to fill this out on paper or through your MyChart. I value your feedback.  Brooke Bonito, MSN, FNP-BC, AGACNP-BC [facility]

## 2024-02-02 ENCOUNTER — Other Ambulatory Visit: Payer: Self-pay | Admitting: Gastroenterology

## 2024-02-12 NOTE — Progress Notes (Deleted)
 GI Office Note    Referring Provider: Rebekah Chesterfield, NP Primary Care Physician:  Rebekah Chesterfield, NP Primary Gastroenterologist: Hennie Duos. Marletta Lor, DO  Date:  02/13/2024  ID:  Amy Santos, Amy Santos [birth year], MRN [id number]   Chief Complaint   No chief complaint on file.  History of Present Illness  Amy Santos is a 78 y.o. female with a history of GERD, constipation, anxiety, depression, dyslipidemia, HTN, neuropathy, chronic pain presenting today to follow up alternating bowel habits and reflux.   ED visit 04/15/23 for weakness, nausea, and loose stools. Reported recurrent near syncope. Discharged on augmentin and zofran. Labs with mild leukocytosis WBC 12.2, normal Hgb. K 3.2.    CT A/P 04/15/23: -mild wall thickening of sigmoid colon with minimal fat stranding. -diverticulosis   Last office visit 04/20/23.  Patient reported symptoms leading up to her ED visit.  She reported her recurrent emesis and near syncope.  She reported a history of syncope years prior.  Does have frequent vertigo for which she takes meclizine nightly.  Has been following with PCP, had not seen cardiology.  Omeprazole working well for her reflux.  Also noted some mild constipation. Advised patient to finish course of Augmentin.  Continue omeprazole 20 mg once daily.  Scheduled for colonoscopy with Dr. Marletta Lor.  Given cardiology referral to heart care, recommended scheduling the colonoscopy after clearance from cardiology.  Advised to use MiraLAX or stool softener for constipation.   Colonoscopy 09/11/2023: -Nonbleeding internal hemorrhoids -Multiple large mouth and small mouth diverticula in sigmoid colon (angulated as a result) -No repeat colonoscopy recommended due to age  Last office visit 01/02/24.  Noted 2 months of urgency postprandially.  Feeling bloated and not wanting to eat.  Usually going within 5-10 minutes after meal.  Occasionally having diarrhea upon wakening.  Stools have been  mushy but not watery.  Usually will have some intermittent constipation as well have to take something to help her go.  Reports gurgling in her stomach.  Denied recent sick contacts.  Continue to take pain medication not taking any MiraLAX, taking Imodium if diarrhea and will occasionally take MiraLAX if she feels constipated.  At times she has taken Dulcolax.  Reflux symptoms pretty well-controlled with omeprazole once daily.  Follows with cardiology for syncopal episodes (cardiology feels as though it is not cardiac in nature but could be orthostatic).  Reported some urinary symptoms. Abdominal x-ray ordered.  Provided samples of Linzess and to call with progress report once done with samples.  Advise continue omeprazole 20 mg once daily.  Provided constipation diet handout.  Follow-up 6 weeks.  KUB ordered but not performed.  Today: Constipation, diarrhea -   GERD -    Wt Readings from Last 3 Encounters:  01/02/24 110 lb 12.8 oz (50.3 kg)  09/26/23 116 lb (52.6 kg)  09/07/23 113 lb 15.7 oz (51.7 kg)    Current Outpatient Medications  Medication Sig Dispense Refill   acetaminophen (TYLENOL) 500 MG tablet Take 500 mg daily as needed by mouth for moderate pain.     albuterol (PROVENTIL HFA;VENTOLIN HFA) 108 (90 Base) MCG/ACT inhaler Inhale 2 puffs every 6 (six) hours as needed into the lungs for wheezing or shortness of breath.     ALPRAZolam (XANAX) 1 MG tablet Take 1 mg by mouth 2 (two) times daily as needed for anxiety.     amLODipine (NORVASC) 5 MG tablet Take 5 mg daily before breakfast by mouth.      aspirin  81 MG tablet Take 81 mg by mouth daily.     Calcium-Vitamin D 600-125 MG-UNIT TABS Take 2 tablets by mouth daily.     cetirizine (ZYRTEC) 10 MG tablet Take by mouth as needed.     diltiazem (CARDIZEM) 60 MG tablet TAKE ONE TABLET BY MOUTH EVERY EIGHT HOURS 60 tablet 2   gabapentin (NEURONTIN) 800 MG tablet Take 800 mg by mouth 3 (three) times daily.     linaclotide (LINZESS) 145  MCG CAPS capsule TAKE 1 CAPSULE BY MOUTH EVERY MORNING BEFORE BREAKFAST 30 capsule 3   loratadine (CLARITIN) 10 MG tablet Take 10 mg daily as needed by mouth.      Magnesium 400 MG CAPS Take 400 mg daily by mouth.      meclizine (ANTIVERT) 25 MG tablet Take 25 mg 3 (three) times daily as needed by mouth for dizziness.     mometasone (NASONEX) 50 MCG/ACT nasal spray as needed.     Multiple Vitamin (MULTIVITAMIN WITH MINERALS) TABS tablet Take 1 tablet daily by mouth.     omeprazole (PRILOSEC) 20 MG capsule Take 1 capsule (20 mg total) by mouth daily. 90 capsule 3   ondansetron (ZOFRAN) 4 MG tablet Take 1 tablet (4 mg total) by mouth every 8 (eight) hours as needed for nausea or vomiting. 20 tablet 0   oxyCODONE-acetaminophen (PERCOCET) 10-325 MG tablet Take 1 tablet by mouth 4 (four) times daily as needed for pain. (Patient not taking: Reported on 01/02/2024)     rOPINIRole (REQUIP) 2 MG tablet Take 4 mg by mouth at bedtime.      rosuvastatin (CRESTOR) 10 MG tablet Take 10 mg by mouth at bedtime.     sodium chloride (OCEAN) 0.65 % SOLN nasal spray Place 1 spray as needed into both nostrils for congestion.     SODIUM FLUORIDE 5000 SENSITIVE 1.1-5 % GEL Take by mouth.     No current facility-administered medications for this visit.    Past Medical History:  Diagnosis Date   Anxiety    Arthritis    tingling down both legs, neuropathy     Chest pain    Chronic pain syndrome    Depression    Dyslipidemia    GERD (gastroesophageal reflux disease)    Head ache    Heart murmur    MVP   Hypertension    Migraines    Mitral valve prolapse    Neuromuscular disorder (HCC)    neuropathy feet & legs    Tobacco abuse     Past Surgical History:  Procedure Laterality Date   ABDOMINAL HYSTERECTOMY     bunyunectomy     CARPAL TUNNEL RELEASE Bilateral    CATARACT EXTRACTION     COLONOSCOPY WITH PROPOFOL N/A 09/11/2023   Procedure: COLONOSCOPY WITH PROPOFOL;  Surgeon: Lanelle Bal, DO;   [facility];  Service: Endoscopy;  Laterality: N/A;  10:15 am, asa 3   EYE SURGERY Bilateral    hysterectomy---unknown     SHOULDER ARTHROSCOPY Right     Family History  Problem Relation Age of Onset   Colon cancer Neg Hx     Allergies as of 02/13/2024 - Review Complete 01/02/2024  Allergen Reaction Noted   Dextromethorphan  11/17/2020   Dextromethorphan hbr  11/17/2020   Doxylamine  11/17/2020   Nyquil multi-symptom [pseudoeph-doxylamine-dm-apap]  03/17/2016   Pseudoephedrine hcl  11/17/2020   Quetiapine  03/08/2021   Sulfacetamide  03/26/2021    Social History   Socioeconomic History  Marital status: Married    Spouse name: Not on file   Number of children: Not on file   Years of education: Not on file   Highest education level: Not on file  Occupational History   Occupation: retired  Tobacco Use   Smoking status: Every Day    Current packs/day: 0.50    Types: Cigarettes   Smokeless tobacco: Never  Substance and Sexual Activity   Alcohol use: No    Alcohol/week: 0.0 standard drinks of alcohol   Drug use: No   Sexual activity: Not Currently  Other Topics Concern   Not on file  Social History Narrative   Not on file   Social Drivers of Health   Financial Resource Strain: Not on file  Food Insecurity: Not on file  Transportation Needs: Not on file  Physical Activity: Not on file  Stress: Not on file  Social Connections: Not on file     Review of Systems   Gen: Denies fever, chills, anorexia. Denies fatigue, weakness, weight loss.  CV: Denies chest pain, palpitations, syncope, peripheral edema, and claudication. Resp: Denies dyspnea at rest, cough, wheezing, coughing up blood, and pleurisy. GI: See HPI Derm: Denies rash, itching, dry skin Psych: Denies depression, anxiety, memory loss, confusion. No homicidal or suicidal ideation.  Heme: Denies bruising, bleeding, and enlarged lymph nodes.  Physical Exam   There were no vitals taken for  this visit.  General:   Alert and oriented. No distress noted. Pleasant and cooperative.  Head:  Normocephalic and atraumatic. Eyes:  Conjuctiva clear without scleral icterus. Mouth:  Oral mucosa pink and moist. Good dentition. No lesions. Lungs:  Clear to auscultation bilaterally. No wheezes, rales, or rhonchi. No distress.  Heart:  S1, S2 present without murmurs appreciated.  Abdomen:  +BS, soft, non-tender and non-distended. No rebound or guarding. No HSM or masses noted. Rectal: *** Msk:  Symmetrical without gross deformities. Normal posture. Extremities:  Without edema. Neurologic:  Alert and  oriented x4 Psych:  Alert and cooperative. Normal mood and affect.  Assessment  Amy Santos is a 78 y.o. female with a history of GERD, constipation, anxiety, depression, dyslipidemia, HTN, neuropathy, chronic pain presenting today with ***  Constipation, urgency, intermitten diarrhea:  GERD:   PLAN   ***     Brooke Bonito, MSN, FNP-BC, AGACNP-BC [facility]

## 2024-02-13 ENCOUNTER — Ambulatory Visit: Payer: 59 | Admitting: Gastroenterology

## 2024-02-13 ENCOUNTER — Encounter: Payer: Self-pay | Admitting: Gastroenterology

## 2024-02-20 ENCOUNTER — Ambulatory Visit: Payer: 59 | Attending: Internal Medicine | Admitting: Internal Medicine

## 2024-02-20 NOTE — Progress Notes (Signed)
 Erroneous encounter - please disregard.

## 2024-02-27 ENCOUNTER — Ambulatory Visit (HOSPITAL_COMMUNITY)
Admission: RE | Admit: 2024-02-27 | Discharge: 2024-02-27 | Disposition: A | Discharge status: Home / Self Care | Source: Ambulatory Visit | Attending: Gastroenterology | Admitting: Gastroenterology

## 2024-02-27 DIAGNOSIS — K5900 Constipation, unspecified: Secondary | ICD-10-CM | POA: Insufficient documentation

## 2024-02-28 ENCOUNTER — Other Ambulatory Visit: Payer: Self-pay | Admitting: Internal Medicine

## 2024-03-12 ENCOUNTER — Ambulatory Visit: Admitting: Gastroenterology

## 2024-03-13 ENCOUNTER — Encounter: Payer: Self-pay | Admitting: Gastroenterology

## 2024-03-14 ENCOUNTER — Encounter: Payer: Self-pay | Admitting: *Deleted

## 2024-03-31 NOTE — Progress Notes (Deleted)
 GI Office Note    Referring Provider: Lenn Quint, NP Primary Care Physician:  Lenn Quint, NP Primary Gastroenterologist: Rolando Cliche. Mordechai April, DO  Date:  03/31/2024  ID:  Amy  OVERA Santos, DOB [birth year], MRN [id number]   Chief Complaint   No chief complaint on file.  History of Present Illness  Amy  Kirk Peper Santos is a 78 y.o. female with a history of *** presenting today with complaint of   ED visit 04/15/23 for weakness, nausea, and loose stools. Reported recurrent near syncope. Discharged on augmentin  and zofran . Labs with mild leukocytosis WBC 12.2, normal Hgb. K 3.2.    CT A/P 04/15/23: -mild wall thickening of sigmoid colon with minimal fat stranding. -diverticulosis   OV 04/20/23.  Patient reported symptoms leading up to her ED visit.  She reported her recurrent emesis and near syncope.  She reported a history of syncope years prior.  Does have frequent vertigo for which she takes meclizine nightly.  Has been following with PCP, had not seen cardiology.  Omeprazole  working well for her reflux.  Also noted some mild constipation. Advised patient to finish course of Augmentin .  Continue omeprazole  20 mg once daily.  Scheduled for colonoscopy with Dr. Mordechai April.  Given cardiology referral to heart care, recommended scheduling the colonoscopy after clearance from cardiology.  Advised to use MiraLAX or stool softener for constipation.  Colonoscopy 09/11/2023: -Nonbleeding internal hemorrhoids -Multiple large mouth and small mouth diverticula in sigmoid colon (angulated as a result) -No repeat colonoscopy recommended due to age  Last office visit 12/07/23. Having urgency, constipation, and intermittent diarrhea. Also c/o bloating. Urgency occurring after meals. Not taking miralax, taking imodium for diarrhea as needed. Admits to lots of flatulence. Continuing to have some syncopal episodes. KUB. Advised Linzess  145 mcg daily. Advised to continue omeprazole  20 mg once daily.  Provided constipation diet handout.   KUB 02/27/24: non obstructed gas pattern with mild stool within the colon.   Today:    Wt Readings from Last 3 Encounters:  01/02/24 110 lb 12.8 oz (50.3 kg)  09/26/23 116 lb (52.6 kg)  09/07/23 113 lb 15.7 oz (51.7 kg)    Current Outpatient Medications  Medication Sig Dispense Refill   acetaminophen  (TYLENOL ) 500 MG tablet Take 500 mg daily as needed by mouth for moderate pain.     albuterol (PROVENTIL HFA;VENTOLIN HFA) 108 (90 Base) MCG/ACT inhaler Inhale 2 puffs every 6 (six) hours as needed into the lungs for wheezing or shortness of breath.     ALPRAZolam (XANAX) 1 MG tablet Take 1 mg by mouth 2 (two) times daily as needed for anxiety.     amLODipine (NORVASC) 5 MG tablet Take 5 mg daily before breakfast by mouth.      aspirin 81 MG tablet Take 81 mg by mouth daily.     Calcium-Vitamin D 600-125 MG-UNIT TABS Take 2 tablets by mouth daily.     cetirizine (ZYRTEC) 10 MG tablet Take by mouth as needed.     diltiazem  (CARDIZEM ) 60 MG tablet TAKE ONE TABLET BY MOUTH EVERY EIGHT HOURS 60 tablet 2   gabapentin (NEURONTIN) 800 MG tablet Take 800 mg by mouth 3 (three) times daily.     linaclotide  (LINZESS ) 145 MCG CAPS capsule TAKE 1 CAPSULE BY MOUTH EVERY MORNING BEFORE BREAKFAST 30 capsule 3   loratadine (CLARITIN) 10 MG tablet Take 10 mg daily as needed by mouth.      Magnesium 400 MG CAPS Take 400 mg daily by mouth.  meclizine (ANTIVERT) 25 MG tablet Take 25 mg 3 (three) times daily as needed by mouth for dizziness.     mometasone (NASONEX) 50 MCG/ACT nasal spray as needed.     Multiple Vitamin (MULTIVITAMIN WITH MINERALS) TABS tablet Take 1 tablet daily by mouth.     omeprazole  (PRILOSEC) 20 MG capsule Take 1 capsule (20 mg total) by mouth daily. 90 capsule 3   ondansetron  (ZOFRAN ) 4 MG tablet Take 1 tablet (4 mg total) by mouth every 8 (eight) hours as needed for nausea or vomiting. 20 tablet 0   oxyCODONE-acetaminophen  (PERCOCET) 10-325 MG  tablet Take 1 tablet by mouth 4 (four) times daily as needed for pain. (Patient not taking: Reported on 01/02/2024)     rOPINIRole (REQUIP) 2 MG tablet Take 4 mg by mouth at bedtime.      rosuvastatin (CRESTOR) 10 MG tablet Take 10 mg by mouth at bedtime.     sodium chloride  (OCEAN) 0.65 % SOLN nasal spray Place 1 spray as needed into both nostrils for congestion.     SODIUM FLUORIDE 5000 SENSITIVE 1.1-5 % GEL Take by mouth.     No current facility-administered medications for this visit.    Past Medical History:  Diagnosis Date   Anxiety    Arthritis    tingling down both legs, neuropathy     Chest pain    Chronic pain syndrome    Depression    Dyslipidemia    GERD (gastroesophageal reflux disease)    Head ache    Heart murmur    MVP   Hypertension    Migraines    Mitral valve prolapse    Neuromuscular disorder (HCC)    neuropathy feet & legs    Tobacco abuse     Past Surgical History:  Procedure Laterality Date   ABDOMINAL HYSTERECTOMY     bunyunectomy     CARPAL TUNNEL RELEASE Bilateral    CATARACT EXTRACTION     COLONOSCOPY WITH PROPOFOL  N/A 09/11/2023   Procedure: COLONOSCOPY WITH PROPOFOL ;  Surgeon: Vinetta Greening, DO;  Location: [facility];  Service: Endoscopy;  Laterality: N/A;  10:15 am, asa 3   EYE SURGERY Bilateral    hysterectomy---unknown     SHOULDER ARTHROSCOPY Right     Family History  Problem Relation Age of Onset   Colon cancer Neg Hx     Allergies as of 04/01/2024 - Review Complete 01/02/2024  Allergen Reaction Noted   Dextromethorphan  11/17/2020   Dextromethorphan hbr  11/17/2020   Doxylamine  11/17/2020   Nyquil multi-symptom [pseudoeph-doxylamine-dm-apap]  03/17/2016   Pseudoephedrine hcl  11/17/2020   Quetiapine  03/08/2021   Sulfacetamide  03/26/2021    Social History   Socioeconomic History   Marital status: Married    Spouse name: Not on file   Number of children: Not on file   Years of education: Not on file   Highest  education level: Not on file  Occupational History   Occupation: retired  Tobacco Use   Smoking status: Every Day    Current packs/day: 0.50    Types: Cigarettes   Smokeless tobacco: Never  Substance and Sexual Activity   Alcohol use: No    Alcohol/week: 0.0 standard drinks of alcohol   Drug use: No   Sexual activity: Not Currently  Other Topics Concern   Not on file  Social History Narrative   Not on file   Social Drivers of Health   Financial Resource Strain: Not on file  Food Insecurity: Not on file  Transportation Needs: Not on file  Physical Activity: Not on file  Stress: Not on file  Social Connections: Not on file     Review of Systems   Gen: Denies fever, chills, anorexia. Denies fatigue, weakness, weight loss.  CV: Denies chest pain, palpitations, syncope, peripheral edema, and claudication. Resp: Denies dyspnea at rest, cough, wheezing, coughing up blood, and pleurisy. GI: See HPI Derm: Denies rash, itching, dry skin Psych: Denies depression, anxiety, memory loss, confusion. No homicidal or suicidal ideation.  Heme: Denies bruising, bleeding, and enlarged lymph nodes.  Physical Exam   There were no vitals taken for this visit.  General:   Alert and oriented. No distress noted. Pleasant and cooperative.  Head:  Normocephalic and atraumatic. Eyes:  Conjuctiva clear without scleral icterus. Mouth:  Oral mucosa pink and moist. Good dentition. No lesions. Lungs:  Clear to auscultation bilaterally. No wheezes, rales, or rhonchi. No distress.  Heart:  S1, S2 present without murmurs appreciated.  Abdomen:  +BS, soft, non-tender and non-distended. No rebound or guarding. No HSM or masses noted. Rectal: *** Msk:  Symmetrical without gross deformities. Normal posture. Extremities:  Without edema. Neurologic:  Alert and  oriented x4 Psych:  Alert and cooperative. Normal mood and affect.  Assessment  Amy  LAURAASHLEY Santos is a 78 y.o. female with a history of  GERD, constipation, anxiety, depression, dyslipidemia, HTN, neuropathy, and chronic pain presenting today ***.  GERD:    Constipation, Fecal urgency, intermittent diarrhea:   PLAN   ***     Julian Obey, MSN, FNP-BC, AGACNP-BC [facility]

## 2024-04-01 ENCOUNTER — Ambulatory Visit: Admitting: Gastroenterology

## 2024-04-03 ENCOUNTER — Encounter: Payer: Self-pay | Admitting: Gastroenterology

## 2024-05-14 ENCOUNTER — Encounter: Payer: Self-pay | Admitting: Internal Medicine

## 2024-05-14 ENCOUNTER — Ambulatory Visit: Attending: Internal Medicine | Admitting: Internal Medicine

## 2024-05-14 ENCOUNTER — Telehealth: Payer: Self-pay | Admitting: Internal Medicine

## 2024-05-14 VITALS — BP 128/72 | HR 82 | Ht 62.0 in | Wt 111.6 lb

## 2024-05-14 DIAGNOSIS — I4719 Other supraventricular tachycardia: Secondary | ICD-10-CM | POA: Diagnosis not present

## 2024-05-14 MED ORDER — DILTIAZEM HCL ER COATED BEADS 120 MG PO CP24
120.0000 mg | ORAL_CAPSULE | Freq: Every day | ORAL | 3 refills | Status: AC
Start: 2024-05-14 — End: ?

## 2024-05-14 MED ORDER — DILTIAZEM HCL ER 120 MG PO CP12: 120.0000 mg | ORAL_CAPSULE | Freq: Every day | ORAL | 3 refills | Status: DC

## 2024-05-14 NOTE — Patient Instructions (Addendum)
 Medication Instructions:  Your physician has recommended you make the following change in your medication:  Increase Diltiazem  from 60 mg to 120 mg once daily Continue all other medications as prescribed  Labwork: None  Testing/Procedures: None  Follow-Up: Your physician recommends that you schedule a follow-up appointment in: Follow up as needed  Any Other Special Instructions Will Be Listed Below (If Applicable). Thank you for choosing [facility]!     If you need a refill on your cardiac medications before your next appointment, please call your pharmacy.

## 2024-05-14 NOTE — Telephone Encounter (Signed)
 Pt c/o medication issue:  1. Name of Medication: diltiazem  (CARDIZEM  SR) 120 MG 12 hr capsule   2. How are you currently taking this medication (dosage and times per day)? -  3. Are you having a reaction (difficulty breathing--STAT)?   4. What is your medication issue? Per pharmacy Cardizem  CD is 24hr type not SR, requesting cb for clarification or send corrected script

## 2024-05-14 NOTE — Telephone Encounter (Signed)
Sent corrected prescription to pharmacy

## 2024-05-14 NOTE — Progress Notes (Signed)
 Cardiology Office Note  Date: 05/14/2024   ID: [name], Amy Santos 30-Jan-1946, MRN [id number]  PCP:  Lenn Quint, NP  Cardiologist:  Lasalle Pointer, MD Electrophysiologist:  None   History of Present Illness: Amy  SHAWNTELL Santos is a 78 y.o. female known to have HTN, HLD is here for follow-up visit.  Event monitor showed atrial tachycardia, 1.5% PAC burden, symptomatic from PAC and NSR.  Echocardiogram showed normal LVEF and no valvular heart disease.  After starting diltiazem  60 mg every 8 hours, her palpitations completely resolved.  Last episode of syncope was in November 2024.  She had to go to [facility] ER in November 2024 and was diagnosed with UTI.  No recurrence of syncope since then.  Does not have angina or DOE.  No dizziness.  She gets leg swelling in the afternoon after she has been on her feet all day long.  Past Medical History:  Diagnosis Date   Anxiety    Arthritis    tingling down both legs, neuropathy     Chest pain    Chronic pain syndrome    Depression    Dyslipidemia    GERD (gastroesophageal reflux disease)    Head ache    Heart murmur    MVP   Hypertension    Migraines    Mitral valve prolapse    Neuromuscular disorder (HCC)    neuropathy feet & legs    Tobacco abuse     Past Surgical History:  Procedure Laterality Date   ABDOMINAL HYSTERECTOMY     bunyunectomy     CARPAL TUNNEL RELEASE Bilateral    CATARACT EXTRACTION     COLONOSCOPY WITH PROPOFOL  N/A 09/11/2023   Procedure: COLONOSCOPY WITH PROPOFOL ;  Surgeon: Vinetta Greening, DO;  Location: [facility];  Service: Endoscopy;  Laterality: N/A;  10:15 am, asa 3   EYE SURGERY Bilateral    hysterectomy---unknown     SHOULDER ARTHROSCOPY Right     Current Outpatient Medications  Medication Sig Dispense Refill   acetaminophen  (TYLENOL ) 500 MG tablet Take 500 mg daily as needed by mouth for moderate pain.     albuterol (PROVENTIL HFA;VENTOLIN HFA) 108 (90 Base) MCG/ACT inhaler  Inhale 2 puffs every 6 (six) hours as needed into the lungs for wheezing or shortness of breath.     ALPRAZolam (XANAX) 1 MG tablet Take 1 mg by mouth 2 (two) times daily as needed for anxiety.     amLODipine (NORVASC) 5 MG tablet Take 5 mg daily before breakfast by mouth.      aspirin 81 MG tablet Take 81 mg by mouth daily.     Calcium-Vitamin D 600-125 MG-UNIT TABS Take 2 tablets by mouth daily.     cetirizine (ZYRTEC) 10 MG tablet Take by mouth as needed.     diltiazem  (CARDIZEM ) 60 MG tablet TAKE ONE TABLET BY MOUTH EVERY EIGHT HOURS 60 tablet 2   gabapentin (NEURONTIN) 800 MG tablet Take 800 mg by mouth 3 (three) times daily.     linaclotide  (LINZESS ) 145 MCG CAPS capsule TAKE 1 CAPSULE BY MOUTH EVERY MORNING BEFORE BREAKFAST 30 capsule 3   loratadine (CLARITIN) 10 MG tablet Take 10 mg daily as needed by mouth.      Magnesium 400 MG CAPS Take 400 mg daily by mouth.      meclizine (ANTIVERT) 25 MG tablet Take 25 mg 3 (three) times daily as needed by mouth for dizziness.     mometasone (NASONEX) 50 MCG/ACT  nasal spray as needed.     Multiple Vitamin (MULTIVITAMIN WITH MINERALS) TABS tablet Take 1 tablet daily by mouth.     omeprazole  (PRILOSEC) 20 MG capsule Take 1 capsule (20 mg total) by mouth daily. 90 capsule 3   ondansetron  (ZOFRAN ) 4 MG tablet Take 1 tablet (4 mg total) by mouth every 8 (eight) hours as needed for nausea or vomiting. 20 tablet 0   oxyCODONE-acetaminophen  (PERCOCET) 10-325 MG tablet Take 1 tablet by mouth 4 (four) times daily as needed for pain. (Patient not taking: Reported on 01/02/2024)     rOPINIRole (REQUIP) 2 MG tablet Take 4 mg by mouth at bedtime.      rosuvastatin (CRESTOR) 10 MG tablet Take 10 mg by mouth at bedtime.     sodium chloride  (OCEAN) 0.65 % SOLN nasal spray Place 1 spray as needed into both nostrils for congestion.     SODIUM FLUORIDE 5000 SENSITIVE 1.1-5 % GEL Take by mouth.     No current facility-administered medications for this visit.    Allergies:  Dextromethorphan, Dextromethorphan hbr, Doxylamine, Nyquil multi-symptom [pseudoeph-doxylamine-dm-apap], Pseudoephedrine hcl, Quetiapine, and Sulfacetamide   Social History: The patient  reports that she has been smoking cigarettes. She has never used smokeless tobacco. She reports that she does not drink alcohol and does not use drugs.   Family History: The patient's family history is not on file.   ROS:  Please see the history of present illness. Otherwise, complete review of systems is positive for none  All other systems are reviewed and negative.   Physical Exam: VS:  Ht 5\' 2"  (1.575 m)   Wt 111 lb 9.6 oz (50.6 kg)   BMI 20.41 kg/m , BMI Body mass index is 20.41 kg/m.  Wt Readings from Last 3 Encounters:  05/14/24 111 lb 9.6 oz (50.6 kg)  01/02/24 110 lb 12.8 oz (50.3 kg)  09/26/23 116 lb (52.6 kg)    General: Patient appears comfortable at rest. HEENT: Conjunctiva and lids normal, oropharynx clear with moist mucosa. Neck: Supple, no elevated JVP or carotid bruits, no thyromegaly. Lungs: Clear to auscultation, nonlabored breathing at rest. Cardiac: Regular rate and rhythm, no S3 or significant systolic murmur, no pericardial rub. Abdomen: Soft, nontender, no hepatomegaly, bowel sounds present, no guarding or rebound. Extremities: No pitting edema, distal pulses 2+. Skin: Warm and dry. Musculoskeletal: No kyphosis. Neuropsychiatric: Alert and oriented x3, affect grossly appropriate.  Recent Labwork: No results found for requested labs within last 365 days.  No results found for: "CHOL", "TRIG", "HDL", "CHOLHDL", "VLDL", "LDLCALC", "LDLDIRECT"   Assessment and Plan:  Syncope, likely noncardiac: No recurrence since November 2024.  Previously, she had syncopal events for 2 years and was occurring frequently, 3-4 times per week.  Echocardiogram showed normal LVEF and no valvular heart disease. Event monitor showed 1.5% PAC burden and atrial tachycardia,  symptomatic with PAC and NSR.  Recent syncopal spells are noncardiac in origin. No further testing is indicated at this time.  No further cardiac workup is needed at this time.  Atrial tachycardia and 1.5% PAC burden: No palpitations on diltiazem  60 mg every 8 hours.  Will switch short acting diltiazem  to long-acting diltiazem  120 mg once daily.  Event monitor from 09/2023 showed no evidence of atrial fibrillation however 27 runs of SVT were noted likely atrial tachycardia, fastest interval lasting 12.7 seconds and longest interval lasting 3 minutes 18 seconds. 1.5% PAC burden, symptomatic with PAC and NSR.   HTN, controlled: Continue current antihypertensives, medication changes  as stated above.    Medication Adjustments/Labs and Tests Ordered: Current medicines are reviewed at length with the patient today.  Concerns regarding medicines are outlined above.    Disposition:  Follow up PRN  Signed Desarai Barrack Beauford Bounds, MD, 05/14/2024 11:19 AM    [facility] [address] [place], [place], [place] [address]

## 2024-05-27 ENCOUNTER — Other Ambulatory Visit: Payer: Self-pay | Admitting: Internal Medicine

## 2024-05-27 ENCOUNTER — Other Ambulatory Visit: Payer: Self-pay | Admitting: Gastroenterology

## 2024-06-05 ENCOUNTER — Telehealth: Payer: Self-pay | Admitting: Internal Medicine

## 2024-06-05 NOTE — Telephone Encounter (Signed)
 Pt c/o swelling/edema: STAT if pt has developed SOB within 24 hours  If swelling, where is the swelling located?   Lower legs and feet  How much weight have you gained and in what time span?   No  Have you gained 2 pounds in a day or 5 pounds in a week?   No  Do you have a log of your daily weights (if so, list)?   No  Are you currently taking a fluid pill?   No  Are you currently SOB?  No  Have you traveled recently in a car or plane for an extended period of time?   No  Patient is concerned she started on diltiazem  (CARDIZEM ) 60 MG tablet and she has been having swelling in her lower legs and feet.

## 2024-06-06 NOTE — Telephone Encounter (Signed)
 No answer

## 2024-06-10 NOTE — Telephone Encounter (Signed)
Attempt #2 no answer
